# Patient Record
Sex: Female | Born: 1960 | Race: White | Hispanic: No | Marital: Married | Smoking: Former smoker
Health system: Southern US, Community
[De-identification: ages and names within clinical notes are randomized; demographics above are authoritative.]

## PROBLEM LIST (undated history)

## (undated) DIAGNOSIS — E119 Type 2 diabetes mellitus without complications: Secondary | ICD-10-CM

## (undated) DIAGNOSIS — Z8489 Family history of other specified conditions: Secondary | ICD-10-CM

## (undated) DIAGNOSIS — E039 Hypothyroidism, unspecified: Secondary | ICD-10-CM

## (undated) DIAGNOSIS — I10 Essential (primary) hypertension: Secondary | ICD-10-CM

## (undated) DIAGNOSIS — M1990 Unspecified osteoarthritis, unspecified site: Secondary | ICD-10-CM

## (undated) DIAGNOSIS — E785 Hyperlipidemia, unspecified: Secondary | ICD-10-CM

## (undated) HISTORY — DX: Hyperlipidemia, unspecified: E78.5

## (undated) HISTORY — PX: KNEE ARTHROSCOPY: SUR90

---

## 1998-08-02 ENCOUNTER — Encounter: Admission: RE | Admit: 1998-08-02 | Discharge: 1998-08-24 | Payer: Self-pay | Admitting: *Deleted

## 2000-02-08 ENCOUNTER — Encounter: Admission: RE | Admit: 2000-02-08 | Discharge: 2000-02-08 | Payer: Self-pay | Admitting: *Deleted

## 2000-02-08 ENCOUNTER — Encounter: Payer: Self-pay | Admitting: *Deleted

## 2002-01-05 ENCOUNTER — Encounter: Admission: RE | Admit: 2002-01-05 | Discharge: 2002-01-05 | Payer: Self-pay | Admitting: Family Medicine

## 2002-01-05 ENCOUNTER — Encounter: Payer: Self-pay | Admitting: Family Medicine

## 2002-12-21 ENCOUNTER — Encounter: Payer: Self-pay | Admitting: Family Medicine

## 2002-12-21 ENCOUNTER — Encounter: Admission: RE | Admit: 2002-12-21 | Discharge: 2002-12-21 | Payer: Self-pay | Admitting: Family Medicine

## 2002-12-28 ENCOUNTER — Other Ambulatory Visit: Admission: RE | Admit: 2002-12-28 | Discharge: 2002-12-28 | Payer: Self-pay | Admitting: Family Medicine

## 2004-06-01 ENCOUNTER — Other Ambulatory Visit: Admission: RE | Admit: 2004-06-01 | Discharge: 2004-06-01 | Payer: Self-pay | Admitting: Family Medicine

## 2004-08-08 ENCOUNTER — Encounter: Admission: RE | Admit: 2004-08-08 | Discharge: 2004-08-08 | Payer: Self-pay | Admitting: Family Medicine

## 2005-03-11 ENCOUNTER — Encounter: Admission: RE | Admit: 2005-03-11 | Discharge: 2005-03-11 | Payer: Self-pay | Admitting: Family Medicine

## 2005-07-29 ENCOUNTER — Other Ambulatory Visit: Admission: RE | Admit: 2005-07-29 | Discharge: 2005-07-29 | Payer: Self-pay | Admitting: Family Medicine

## 2005-08-30 ENCOUNTER — Encounter: Admission: RE | Admit: 2005-08-30 | Discharge: 2005-08-30 | Payer: Self-pay | Admitting: Family Medicine

## 2006-07-18 ENCOUNTER — Other Ambulatory Visit: Admission: RE | Admit: 2006-07-18 | Discharge: 2006-07-18 | Payer: Self-pay | Admitting: Family Medicine

## 2006-09-09 ENCOUNTER — Encounter: Admission: RE | Admit: 2006-09-09 | Discharge: 2006-09-09 | Payer: Self-pay | Admitting: Family Medicine

## 2007-07-10 ENCOUNTER — Other Ambulatory Visit: Admission: RE | Admit: 2007-07-10 | Discharge: 2007-07-10 | Payer: Self-pay | Admitting: Family Medicine

## 2007-09-11 ENCOUNTER — Encounter: Admission: RE | Admit: 2007-09-11 | Discharge: 2007-09-11 | Payer: Self-pay | Admitting: Family Medicine

## 2008-07-12 ENCOUNTER — Other Ambulatory Visit: Admission: RE | Admit: 2008-07-12 | Discharge: 2008-07-12 | Payer: Self-pay | Admitting: Family Medicine

## 2008-09-12 ENCOUNTER — Encounter: Admission: RE | Admit: 2008-09-12 | Discharge: 2008-09-12 | Payer: Self-pay | Admitting: Family Medicine

## 2010-02-10 IMAGING — MG MM SCREEN MAMMOGRAM BILATERAL
5 series · 5 of 5 positions shown · non-contrast
Comparison: none

DG SCREEN MAMMOGRAM BILATERAL
Bilateral CC and MLO view(s) were taken.

DIGITAL SCREENING MAMMOGRAM WITH CAD:
There are scattered fibroglandular densities.  No masses or malignant type calcifications are 
identified.  Compared with prior studies.

[R CC]
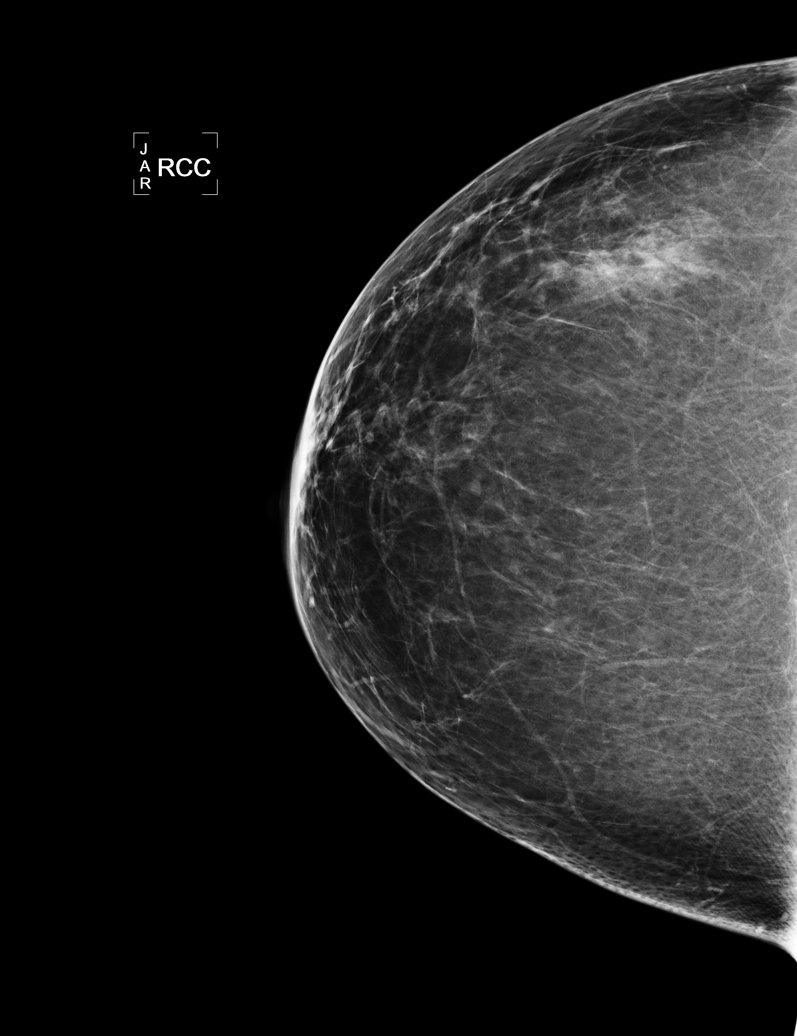

[L CC]
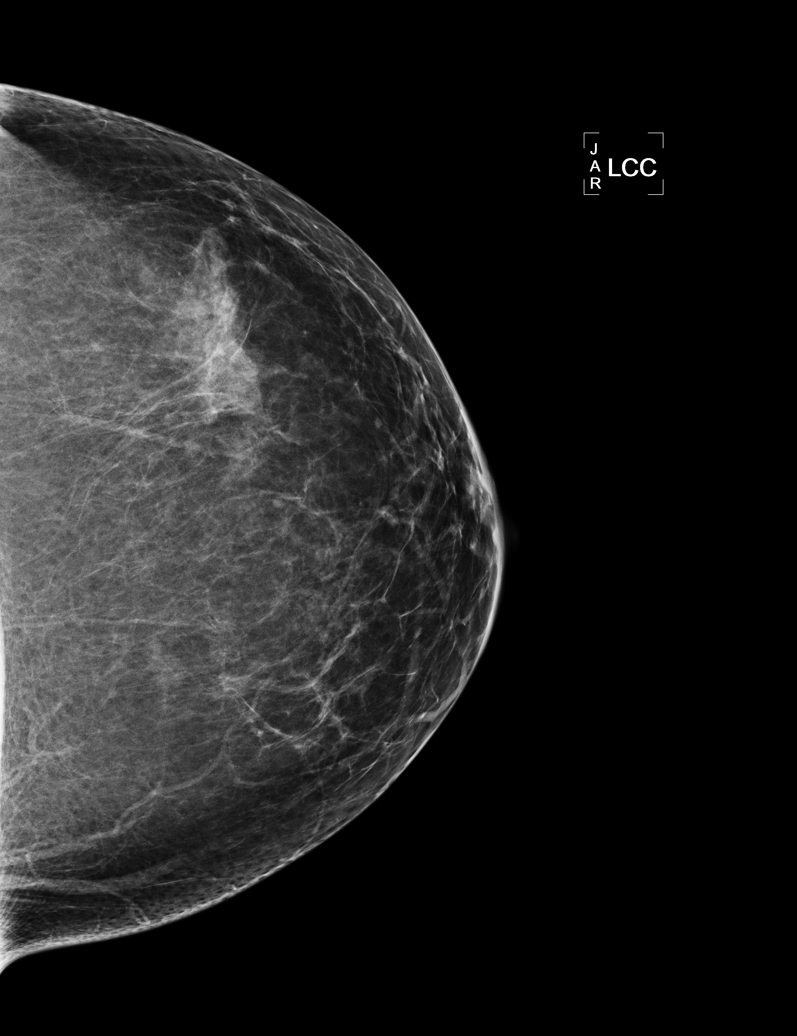

[L MLO (1 of 2)]
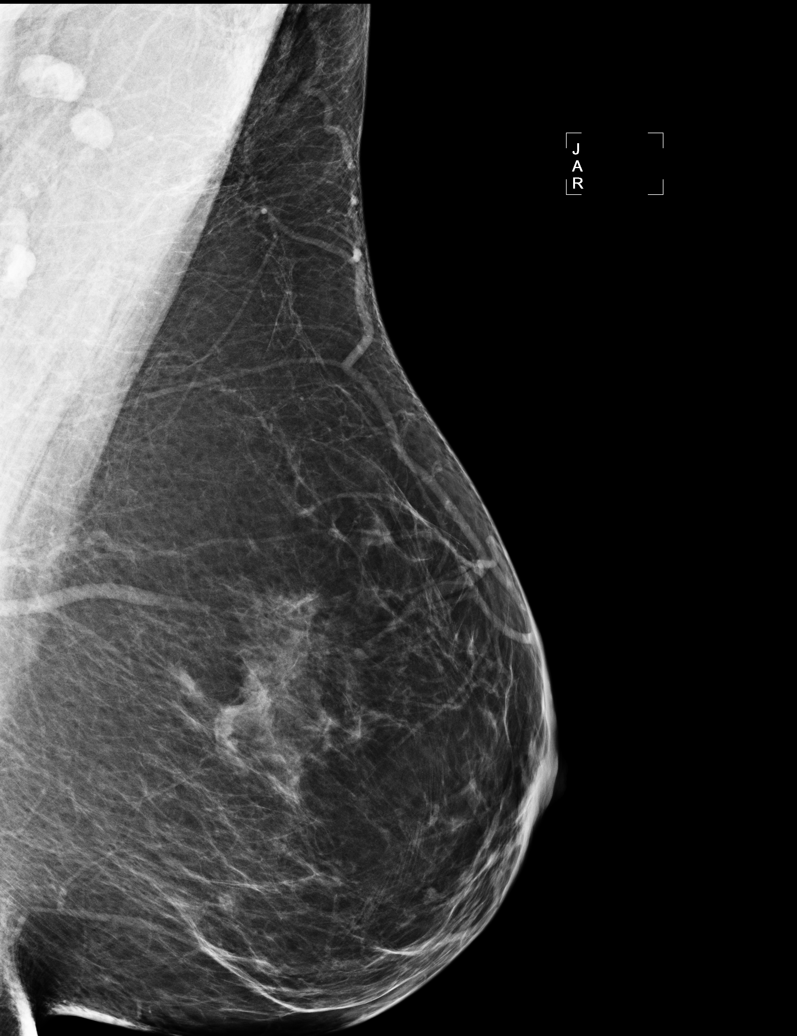

[R MLO]
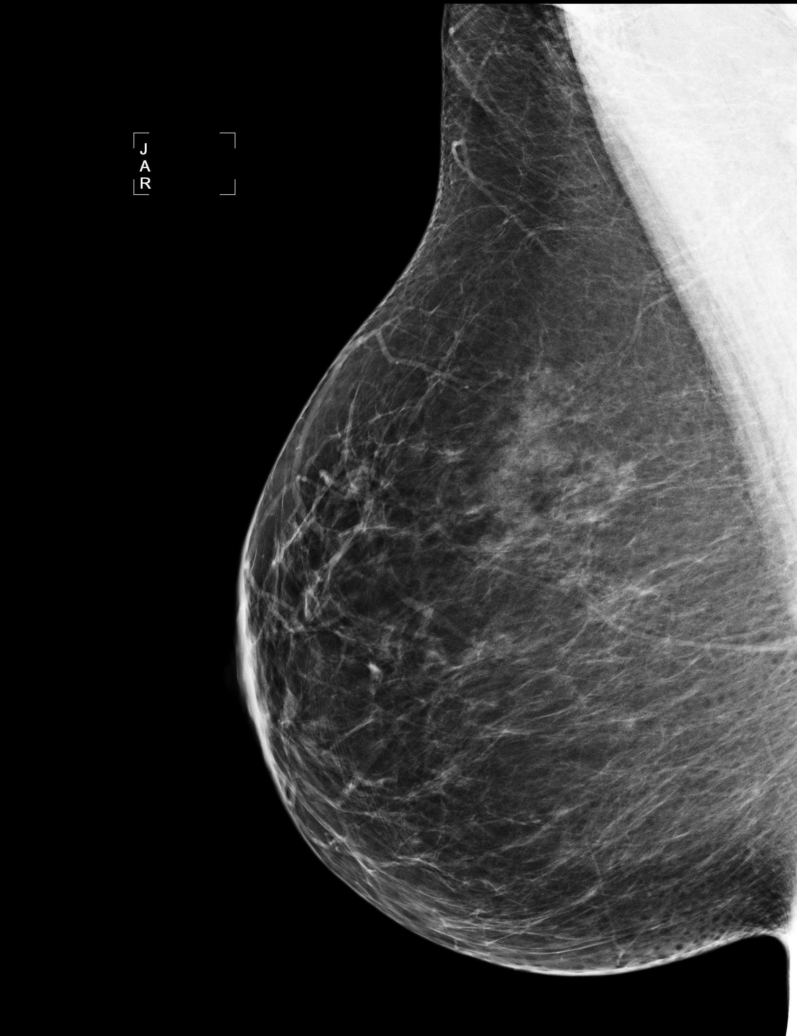

[L MLO (2 of 2)]
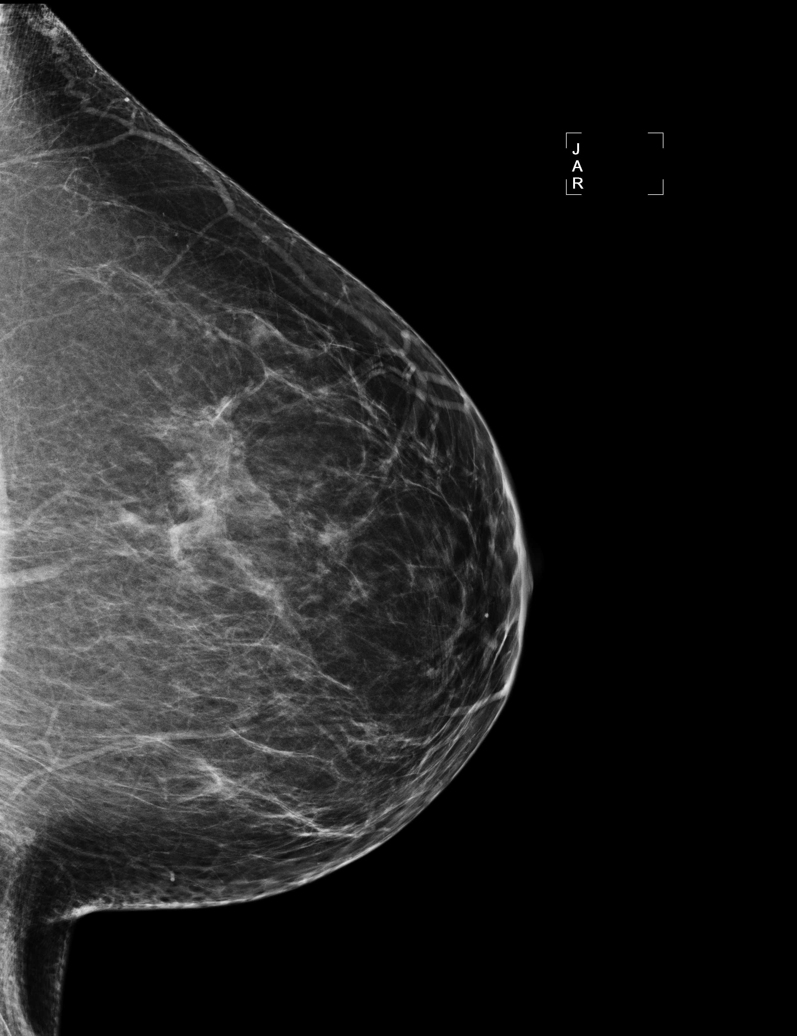

[5 of 5 positions shown; findings below may reference images not displayed]

IMPRESSION: No specific mammographic evidence of malignancy.  Next screening mammogram is recommended in one 
year.

ASSESSMENT: Negative - BI-RADS 1

Screening mammogram in 1 year.
ANALYZED BY COMPUTER AIDED DETECTION. , THIS PROCEDURE WAS A DIGITAL MAMMOGRAM.

## 2010-08-21 ENCOUNTER — Other Ambulatory Visit
Admission: RE | Admit: 2010-08-21 | Discharge: 2010-08-21 | Payer: Self-pay | Source: Home / Self Care | Admitting: Internal Medicine

## 2012-03-16 ENCOUNTER — Other Ambulatory Visit: Payer: Self-pay | Admitting: Internal Medicine

## 2012-03-16 DIAGNOSIS — Z1231 Encounter for screening mammogram for malignant neoplasm of breast: Secondary | ICD-10-CM

## 2012-03-31 ENCOUNTER — Ambulatory Visit: Payer: Self-pay

## 2012-04-06 ENCOUNTER — Ambulatory Visit
Admission: RE | Admit: 2012-04-06 | Discharge: 2012-04-06 | Disposition: A | Discharge status: Home / Self Care | Payer: PRIVATE HEALTH INSURANCE | Source: Ambulatory Visit | Attending: Internal Medicine | Admitting: Internal Medicine

## 2012-04-06 DIAGNOSIS — Z1231 Encounter for screening mammogram for malignant neoplasm of breast: Secondary | ICD-10-CM

## 2013-06-23 ENCOUNTER — Other Ambulatory Visit: Payer: Self-pay | Admitting: Internal Medicine

## 2013-06-23 DIAGNOSIS — R609 Edema, unspecified: Secondary | ICD-10-CM

## 2013-06-28 ENCOUNTER — Other Ambulatory Visit: Payer: PRIVATE HEALTH INSURANCE

## 2013-09-05 IMAGING — MG MM DIGITAL SCREENING BILAT
4 series · 4 of 4 positions shown · non-contrast
Comparison: Previous exams.

CLINICAL DATA: Screening.

DIGITAL BILATERAL SCREENING MAMMOGRAM WITH CAD

[R CC]
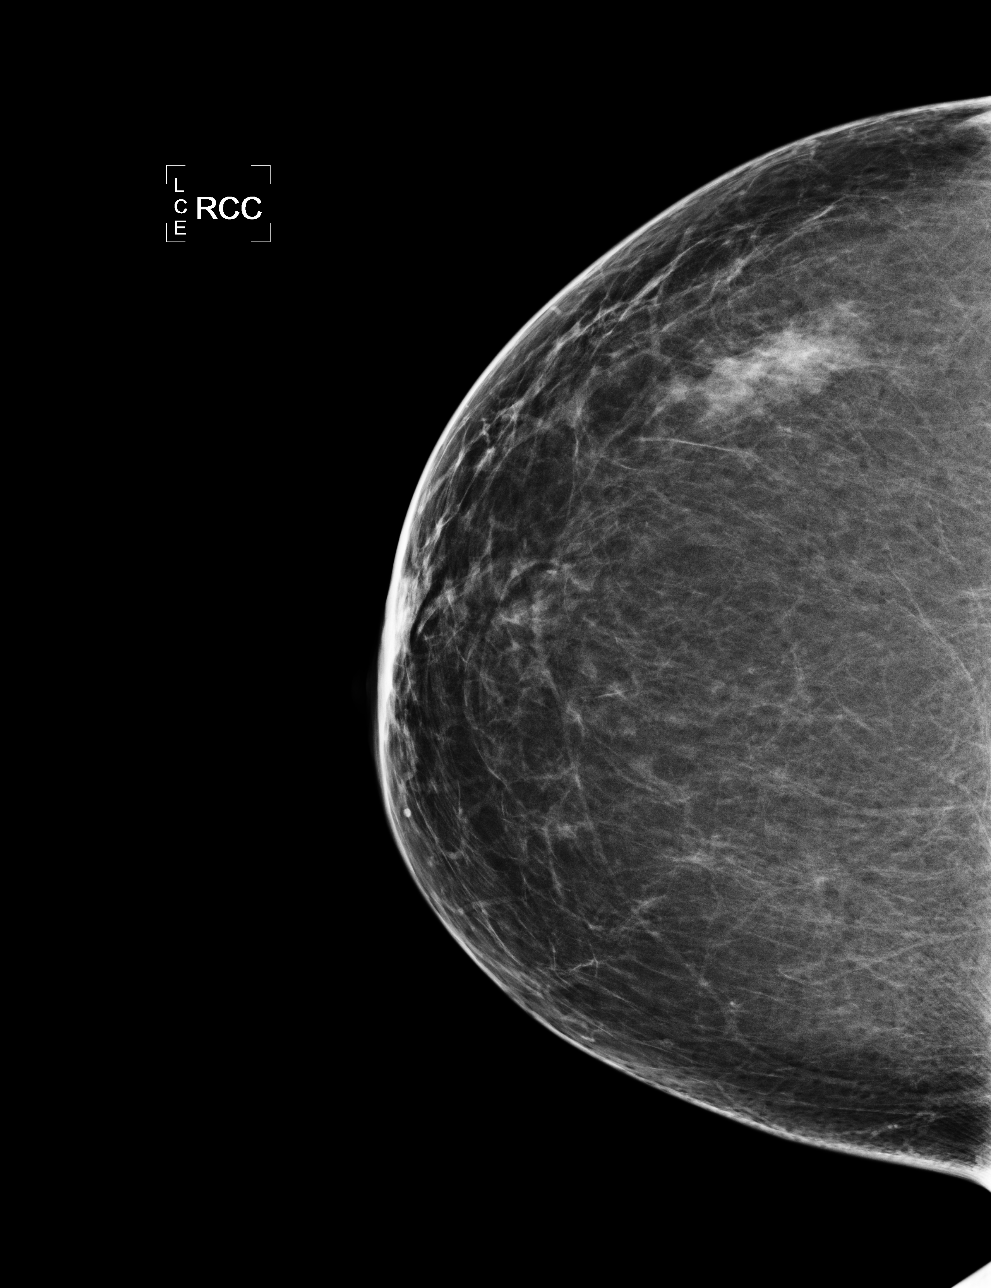

[L CC]
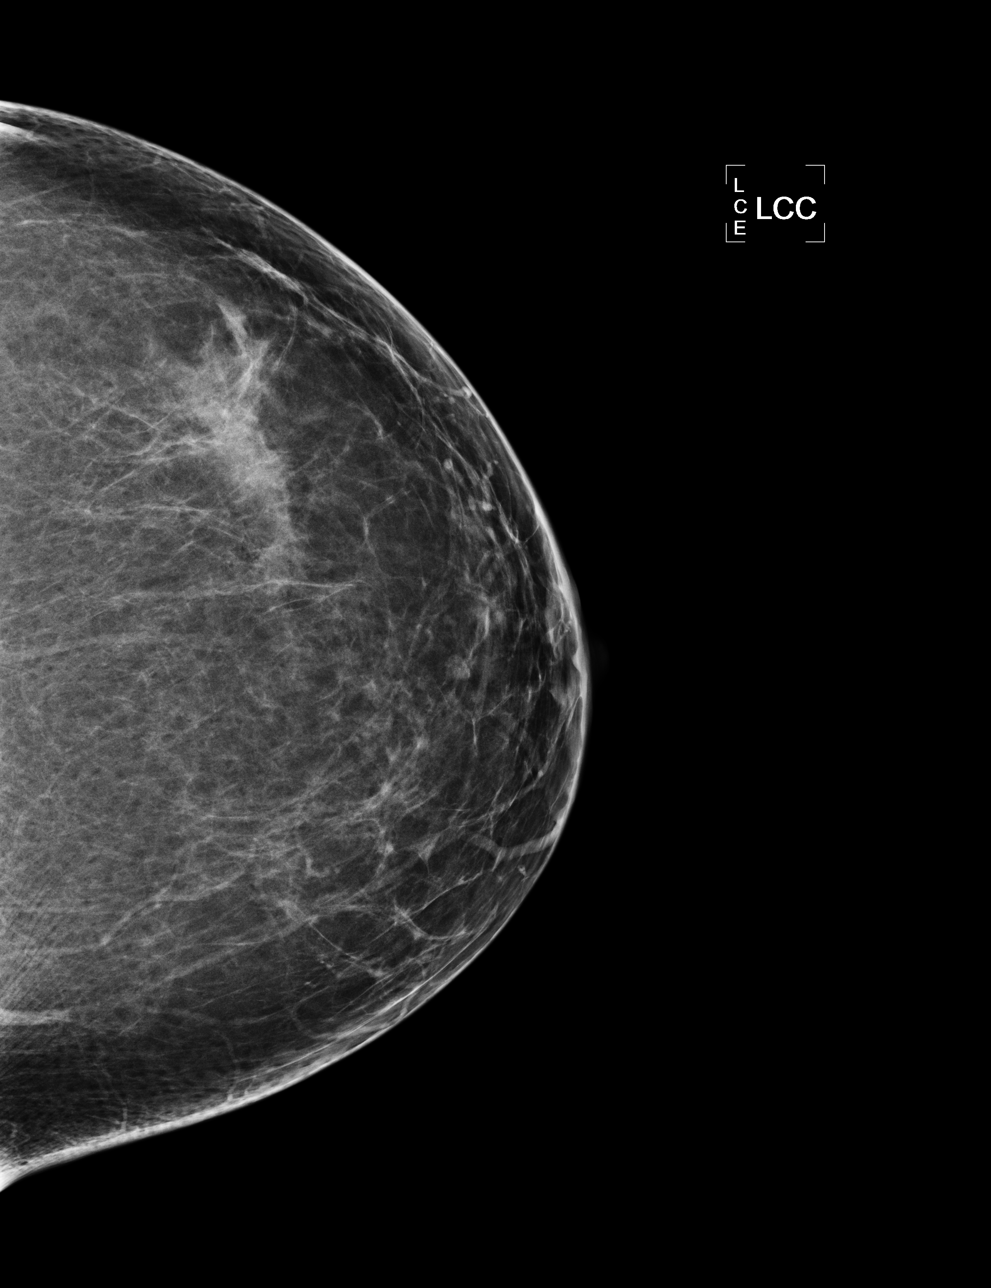

[L MLO]
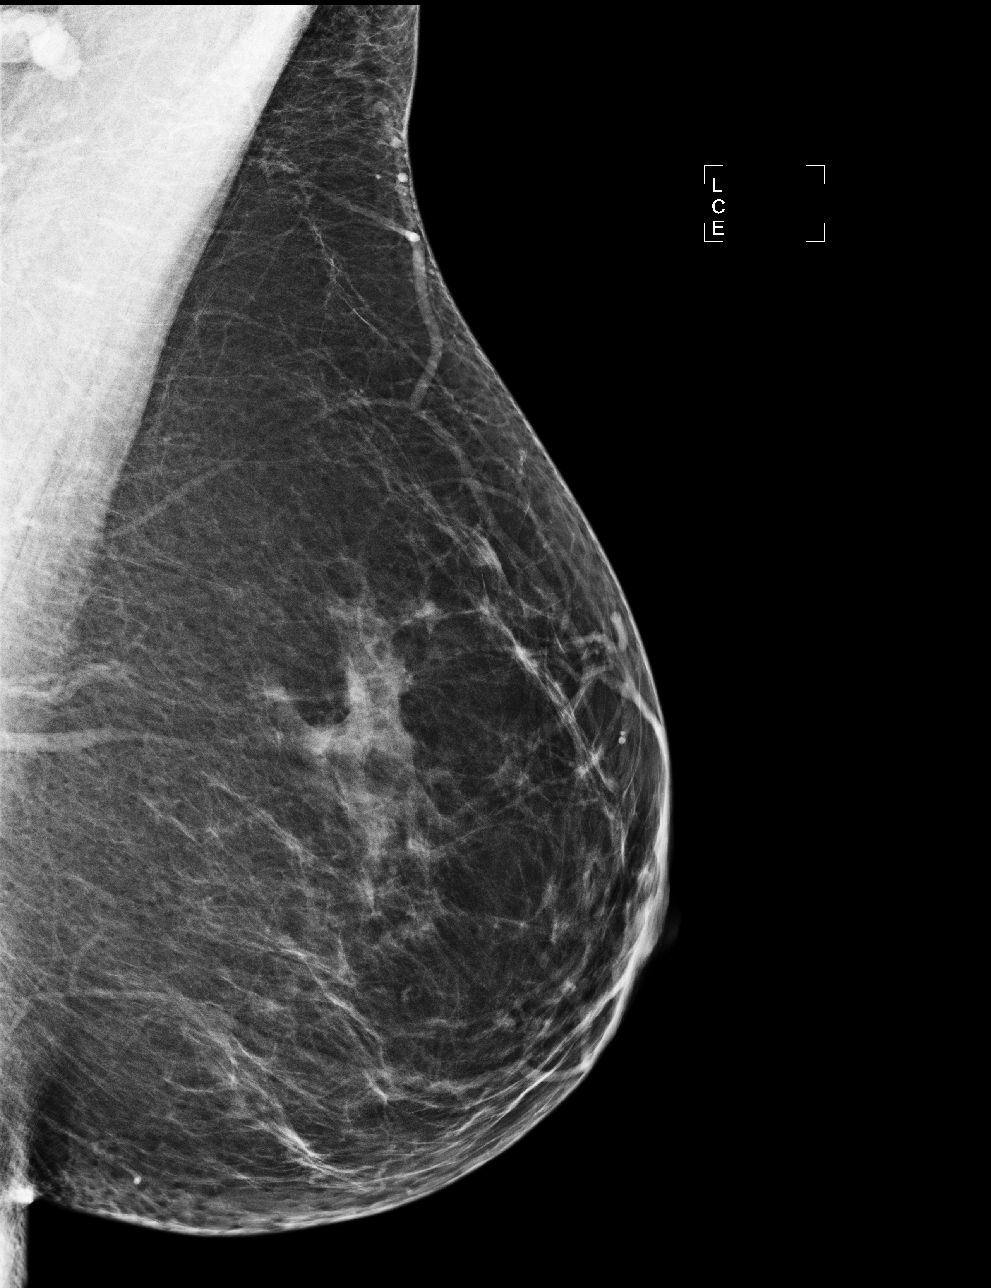

[R MLO]
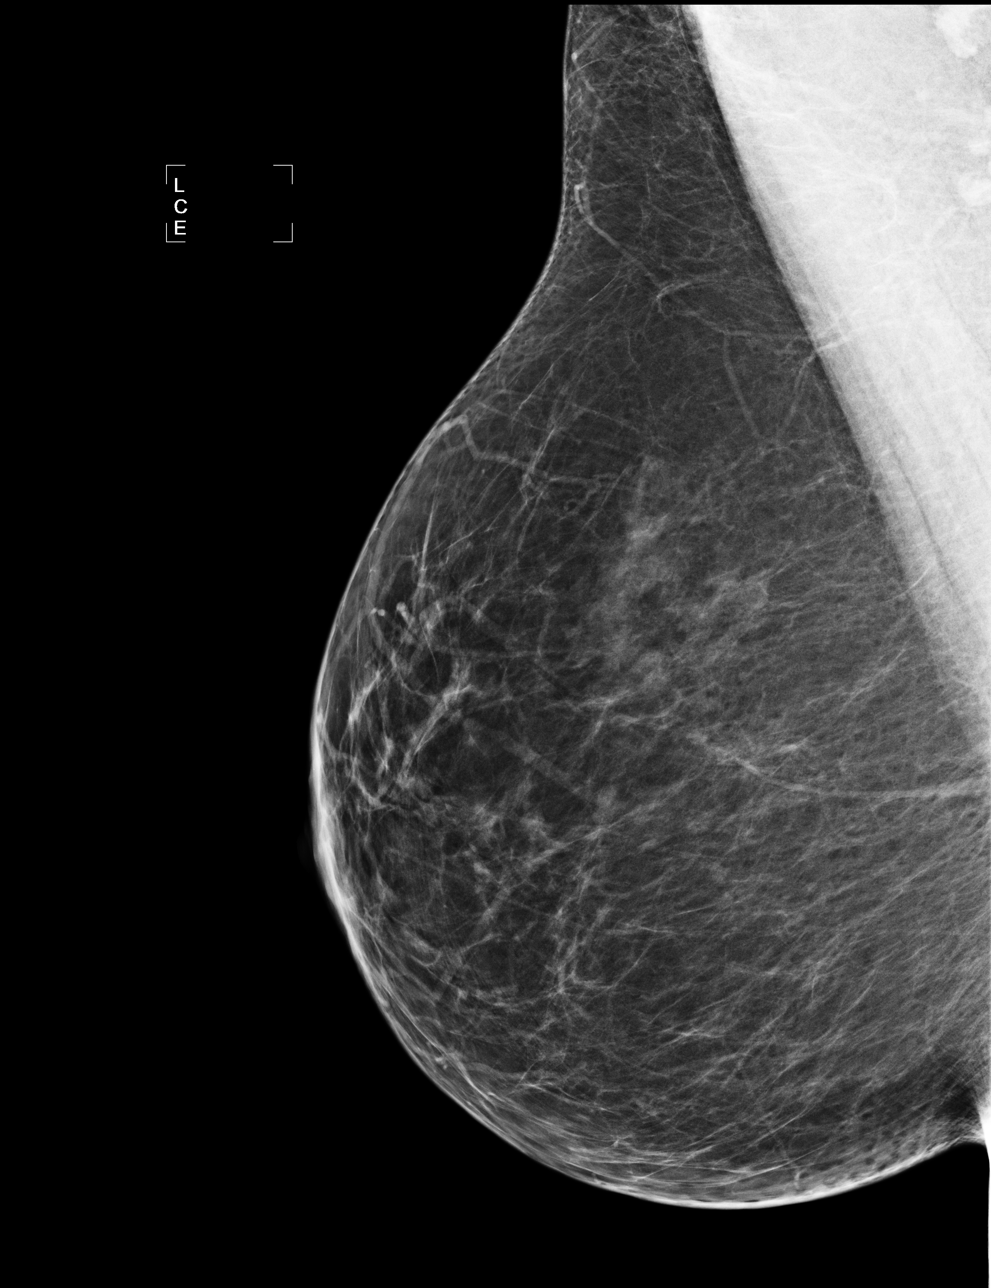

[4 of 4 positions shown; findings below may reference images not displayed]

FINDINGS: There are scattered fibroglandular densities. No
suspicious masses, architectural distortion, or calcifications are
present.

Images were processed with CAD.
IMPRESSION: No mammographic evidence of malignancy.

A result letter of this screening mammogram will be mailed directly
to the patient.

RECOMMENDATION:
Screening mammogram in one year. (Code:[id number]-[id number])

BI-RADS CATEGORY 1:  Negative.

## 2014-11-02 ENCOUNTER — Other Ambulatory Visit (HOSPITAL_COMMUNITY)
Admission: RE | Admit: 2014-11-02 | Discharge: 2014-11-02 | Disposition: A | Discharge status: Home / Self Care | Payer: 59 | Source: Ambulatory Visit | Attending: Internal Medicine | Admitting: Internal Medicine

## 2014-11-02 DIAGNOSIS — Z01419 Encounter for gynecological examination (general) (routine) without abnormal findings: Secondary | ICD-10-CM | POA: Diagnosis not present

## 2015-04-25 ENCOUNTER — Ambulatory Visit: Payer: PRIVATE HEALTH INSURANCE

## 2019-11-25 ENCOUNTER — Other Ambulatory Visit: Payer: Self-pay | Admitting: Internal Medicine

## 2019-11-25 DIAGNOSIS — Z1231 Encounter for screening mammogram for malignant neoplasm of breast: Secondary | ICD-10-CM

## 2020-01-17 ENCOUNTER — Other Ambulatory Visit: Payer: Self-pay

## 2020-01-17 ENCOUNTER — Ambulatory Visit
Admission: RE | Admit: 2020-01-17 | Discharge: 2020-01-17 | Disposition: A | Discharge status: Home / Self Care | Payer: 59 | Source: Ambulatory Visit | Attending: Internal Medicine | Admitting: Internal Medicine

## 2020-01-17 ENCOUNTER — Ambulatory Visit: Payer: PRIVATE HEALTH INSURANCE

## 2020-01-17 DIAGNOSIS — Z1231 Encounter for screening mammogram for malignant neoplasm of breast: Secondary | ICD-10-CM

## 2020-04-26 ENCOUNTER — Ambulatory Visit (INDEPENDENT_AMBULATORY_CARE_PROVIDER_SITE_OTHER): Payer: 59

## 2020-04-26 ENCOUNTER — Encounter: Payer: Self-pay | Admitting: Podiatry

## 2020-04-26 ENCOUNTER — Ambulatory Visit (INDEPENDENT_AMBULATORY_CARE_PROVIDER_SITE_OTHER): Payer: 59 | Admitting: Podiatry

## 2020-04-26 VITALS — BP 139/57 | HR 78 | Temp 96.0°F | Resp 16

## 2020-04-26 DIAGNOSIS — M779 Enthesopathy, unspecified: Secondary | ICD-10-CM | POA: Diagnosis not present

## 2020-04-26 DIAGNOSIS — S90229A Contusion of unspecified lesser toe(s) with damage to nail, initial encounter: Secondary | ICD-10-CM

## 2020-04-26 NOTE — Progress Notes (Signed)
Subjective:   Patient ID: Leslie Potts, female   DOB: 59 y.o.   MRN: [id number]   HPI Patient presents stating that she hurt her toe about 5 weeks ago and the joint has become very tender and swollen and she wants to make sure she does not have a fracture and states she cannot bear good weight down on this foot.  Patient smokes 1/4 pack/day and likes to be active   Review of Systems  All other systems reviewed and are negative.       Objective:  Physical Exam Vitals and nursing note reviewed.  Constitutional:      Appearance: She is well-developed.  Pulmonary:     Effort: Pulmonary effort is normal.  Musculoskeletal:        General: Normal range of motion.  Skin:    General: Skin is warm.  Neurological:     Mental Status: She is alert.     Neurovascular status intact muscle strength found to be adequate range of motion within normal limits.  Patient is noted to have exquisite discomfort in the right second MPJ with fluid buildup around the joint and swelling around the joint surface.  Patient has good digital perfusion well oriented x3     Assessment:  Possibility for inflammatory capsulitis second MPJ with previous injury with possibility for fracture of the digit or other pathology     Plan:  H&P x-ray reviewed condition discussed.  At this point I did do a sterile forefoot block I then aspirated the joint getting out a small amount of clear fluid with no blood injected quarter cc dexamethasone and applied padding to reduce pressure.  This should hopefully improve the result and if any issues were to occur she is to let us know and I did not see fracture of the digit  X-rays indicate no signs of fracture of the digit good alignment noted currently with no signs of long-term pathology

## 2020-06-27 ENCOUNTER — Other Ambulatory Visit: Payer: Self-pay | Admitting: Internal Medicine

## 2020-06-27 DIAGNOSIS — Z720 Tobacco use: Secondary | ICD-10-CM

## 2020-07-11 ENCOUNTER — Ambulatory Visit
Admission: RE | Admit: 2020-07-11 | Discharge: 2020-07-11 | Disposition: A | Discharge status: Home / Self Care | Payer: 59 | Source: Ambulatory Visit | Attending: Internal Medicine | Admitting: Internal Medicine

## 2020-07-11 DIAGNOSIS — Z720 Tobacco use: Secondary | ICD-10-CM

## 2020-09-22 ENCOUNTER — Ambulatory Visit
Admission: RE | Admit: 2020-09-22 | Discharge: 2020-09-22 | Disposition: A | Discharge status: Home / Self Care | Payer: 59 | Source: Ambulatory Visit | Attending: Physician Assistant | Admitting: Physician Assistant

## 2020-09-22 ENCOUNTER — Other Ambulatory Visit: Payer: Self-pay | Admitting: Physician Assistant

## 2020-09-22 ENCOUNTER — Other Ambulatory Visit: Payer: Self-pay

## 2020-09-22 DIAGNOSIS — R52 Pain, unspecified: Secondary | ICD-10-CM

## 2020-09-26 NOTE — Pre-Procedure Instructions (Signed)
[id number] [place], [place] [facility] [place] [address] [place] [address] Phone: [phone] Fax: [phone]      Your procedure is scheduled on Thursday, January 27th at 7:30 A.M.  Report to [facility] Main Entrance "A" at 5:30 A.M., and check in at the Admitting office.  Call this number if you have problems the morning of surgery:  [phone]  Call [phone] if you have any questions prior to your surgery date Monday-Friday 8am-4pm    Remember:  Do not eat after midnight the night before your surgery  You may drink clear liquids until 4:30 A.M. the morning of your surgery.   Clear liquids allowed are: Water, Non-Citrus Juices (without pulp), Carbonated Beverages, Clear Tea, Black Coffee Only, and Gatorade. Please chose diet or no sugar options.    Please complete the 10oz bottle of water that was provided to you by 4:30 a.m. the morning of your surgery.   Take these medicines the morning of surgery:  carvedilol (COREG) levothyroxine (SYNTHROID)  Eye drops-use as needed  Follow your surgeon's instructions on when to stop Aspirin.  If no instructions were given by your surgeon then you will need to call the office to get those instructions.    As of today, STOP taking any Aspirin (unless otherwise instructed by your surgeon) Aleve, Naproxen, Ibuprofen, Motrin, Advil, Goody's, BC's, all herbal medications, fish oil, and all vitamins.   WHAT DO I DO ABOUT MY DIABETES MEDICATION?   Marland Kitchen Do not take pioglitazone (ACTOS) or metFORMIN (GLUCOPHAGE)  the morning of surgery.  . The day of surgery, do not take diabetes injectables (OZEMPIC).   HOW TO MANAGE YOUR DIABETES BEFORE AND AFTER SURGERY  Why is it important to control my blood sugar before and after surgery? . Improving blood sugar levels before and after surgery helps healing and can limit problems. . A way of improving blood sugar  control is eating a healthy diet by: o  Eating less sugar and carbohydrates o  Increasing activity/exercise o  Talking with your doctor about reaching your blood sugar goals . High blood sugars (greater than 180 mg/dL) can raise your risk of infections and slow your recovery, so you will need to focus on controlling your diabetes during the weeks before surgery. . Make sure that the doctor who takes care of your diabetes knows about your planned surgery including the date and location.  How do I manage my blood sugar before surgery? . Check your blood sugar at least 4 times a day, starting 2 days before surgery, to make sure that the level is not too high or low. . Check your blood sugar the morning of your surgery when you wake up and every 2 hours until you get to the Short Stay unit. o If your blood sugar is less than 70 mg/dL, you will need to treat for low blood sugar: - Do not take insulin. - Treat a low blood sugar (less than 70 mg/dL) with  cup of clear juice (cranberry or apple), 4 glucose tablets, OR glucose gel. - Recheck blood sugar in 15 minutes after treatment (to make sure it is greater than 70 mg/dL). If your blood sugar is not greater than 70 mg/dL on recheck, call [phone] for further instructions. . Report your blood sugar to the short stay nurse when you get to Short Stay.  . If you are admitted to the hospital after surgery: o Your blood  sugar will be checked by the staff and you will probably be given insulin after surgery (instead of oral diabetes medicines) to make sure you have good blood sugar levels. o The goal for blood sugar control after surgery is 80-180 mg/dL.              Do not wear jewelry, make up, or nail polish            Do not wear lotions, powders, perfumes, or deodorant.            Do not shave 48 hours prior to surgery.            Do not bring valuables to the hospital.            [facility] is not responsible for any belongings or  valuables.  Do NOT Smoke (Tobacco/Vaping) or drink Alcohol 24 hours prior to your procedure If you use a CPAP at night, you may bring all equipment for your overnight stay.   Contacts, glasses, dentures or bridgework may not be worn into surgery.      For patients admitted to the hospital, discharge time will be determined by your treatment team.   Patients discharged the day of surgery will not be allowed to drive home, and someone needs to stay with them for 24 hours.    Special instructions:                                     [facility]- Preparing for Total Shoulder Arthroplasty   Before surgery, you can play an important role. Because skin is not sterile, your skin needs to be as free of germs as possible. You can reduce the number of germs on your skin by using the following products. . Benzoyl Peroxide Gel o Reduces the number of germs present on the skin o Applied twice a day to shoulder area starting two days before surgery   . Chlorhexidine Gluconate (CHG) Soap o An antiseptic cleaner that kills germs and bonds with the skin to continue killing germs even after washing o Used for showering the night before surgery and morning of surgery   Oral Hygiene is also important to reduce your risk of infection.                                    Remember - BRUSH YOUR TEETH THE MORNING OF SURGERY WITH YOUR REGULAR TOOTHPASTE  ==================================================================  Please follow these instructions carefully:  BENZOYL PEROXIDE 5% GEL  Please do not use if you have an allergy to benzoyl peroxide.   If your skin becomes reddened/irritated stop using the benzoyl peroxide.  Starting two days before surgery, apply as follows: 1. Apply benzoyl peroxide in the morning and at night. Apply after taking a shower. If you are not taking a shower clean entire shoulder front, back, and side along with the armpit with a clean wet washcloth.  2. Place a  quarter-sized dollop on your shoulder and rub in thoroughly, making sure to cover the front, back, and side of your shoulder, along with the armpit.   2 days before ____ AM   ____ PM              1 day before ____ AM   ____ PM  3.  Do this twice a day for two days.  (Last application is the night before surgery, AFTER using the CHG soap as described below).  4. Do NOT apply benzoyl peroxide gel on the day of surgery.  CHLORHEXIDINE GLUCONATE (CHG) SOAP  Please do not use if you have an allergy to CHG or antibacterial soaps. If your skin becomes reddened/irritated stop using the CHG.   Do not shave (including legs and underarms) for at least 48 hours prior to first CHG shower. It is OK to shave your face.  Starting the night before surgery, use CHG soap as follows:  1. Shower the NIGHT BEFORE SURGERY and MORNING OF SURGERY with CHG.  2. If you choose to wash your hair, wash your hair first as usual with your normal shampoo.  3. After shampooing, rinse your hair and body thoroughly to remove the shampoo.  4. Use CHG as you would any other liquid soap.  You can apply CHG directly to the skin and wash gently with a scrungie or a clean washcloth.  5. Apply the CHG soap to your body ONLY FROM THE NECK DOWN.  Do not use on open wounds or open sores.  Avoid contact with your eyes, ears, mouth, and genitals (private parts).  Wash face and genitals (private parts) with your normal soap.  6. Wash thoroughly, paying special attention to the area where your surgery will be performed.  7. Thoroughly rinse your body with warm water from the neck down.  8. DO NOT shower/wash with your normal soap after using and rinsing off the CHG soap.   9. Pat yourself dry with a CLEAN TOWEL.   10.  Apply benzoyl peroxide.   11. Wear CLEAN PAJAMAS to bed the night before surgery; wear comfortable clothes the morning of surgery.  12. Place CLEAN SHEETS on your bed the night of  your first shower and DO NOT SLEEP WITH PETS.  Day of Surgery: Shower as above Do not apply any deodorants/lotions.  Please wear clean clothes to the hospital/surgery center.   Remember to brush your teeth WITH YOUR REGULAR TOOTHPASTE.     Day of Surgery: Wear Clean/Comfortable clothing the morning of surgery Do not apply any deodorants/lotions.   Remember to brush your teeth WITH YOUR REGULAR TOOTHPASTE.   Please read over the following fact sheets that you were given.

## 2020-09-27 ENCOUNTER — Encounter (HOSPITAL_COMMUNITY): Payer: Self-pay

## 2020-09-27 ENCOUNTER — Other Ambulatory Visit (HOSPITAL_COMMUNITY)
Admission: RE | Admit: 2020-09-27 | Discharge: 2020-09-27 | Disposition: A | Discharge status: Home / Self Care | Payer: 59 | Source: Ambulatory Visit | Attending: Orthopaedic Surgery | Admitting: Orthopaedic Surgery

## 2020-09-27 ENCOUNTER — Other Ambulatory Visit: Payer: Self-pay

## 2020-09-27 ENCOUNTER — Encounter (HOSPITAL_COMMUNITY)
Admission: RE | Admit: 2020-09-27 | Discharge: 2020-09-27 | Disposition: A | Discharge status: Home / Self Care | Payer: 59 | Source: Ambulatory Visit | Attending: Orthopaedic Surgery | Admitting: Orthopaedic Surgery

## 2020-09-27 DIAGNOSIS — Z20822 Contact with and (suspected) exposure to covid-19: Secondary | ICD-10-CM | POA: Insufficient documentation

## 2020-09-27 DIAGNOSIS — Z01812 Encounter for preprocedural laboratory examination: Secondary | ICD-10-CM | POA: Insufficient documentation

## 2020-09-27 HISTORY — DX: Hypothyroidism, unspecified: E03.9

## 2020-09-27 HISTORY — DX: Type 2 diabetes mellitus without complications: E11.9

## 2020-09-27 HISTORY — DX: Family history of other specified conditions: Z84.89

## 2020-09-27 HISTORY — DX: Essential (primary) hypertension: I10

## 2020-09-27 HISTORY — DX: Unspecified osteoarthritis, unspecified site: M19.90

## 2020-09-27 LAB — COMPREHENSIVE METABOLIC PANEL
ALT: 27 U/L (ref 0–44)
AST: 27 U/L (ref 15–41)
Albumin: 3.2 g/dL — ABNORMAL LOW (ref 3.5–5.0)
Alkaline Phosphatase: 59 U/L (ref 38–126)
Anion gap: 11 (ref 5–15)
BUN: 10 mg/dL (ref 6–20)
CO2: 23 mmol/L (ref 22–32)
Calcium: 8.8 mg/dL — ABNORMAL LOW (ref 8.9–10.3)
Chloride: 104 mmol/L (ref 98–111)
Creatinine, Ser: 0.51 mg/dL (ref 0.44–1.00)
GFR, Estimated: >60 mL/min (ref >60)
Glucose, Bld: 174 mg/dL — ABNORMAL HIGH (ref 70–99)
Potassium: 4 mmol/L (ref 3.5–5.1)
Sodium: 138 mmol/L (ref 135–145)
Total Bilirubin: 0.4 mg/dL (ref 0.3–1.2)
Total Protein: 6.3 g/dL — ABNORMAL LOW (ref 6.5–8.1)

## 2020-09-27 LAB — SURGICAL PCR SCREEN
MRSA, PCR: NEGATIVE
Staphylococcus aureus: POSITIVE — AB

## 2020-09-27 LAB — CBC
HCT: 32.1 % — ABNORMAL LOW (ref 36.0–46.0)
Hemoglobin: 11.1 g/dL — ABNORMAL LOW (ref 12.0–15.0)
MCH: 32.8 pg (ref 26.0–34.0)
MCHC: 34.6 g/dL (ref 30.0–36.0)
MCV: 95 fL (ref 80.0–100.0)
Platelets: 358 10*3/uL (ref 150–400)
RBC: 3.38 MIL/uL — ABNORMAL LOW (ref 3.87–5.11)
RDW: 12.2 % (ref 11.5–15.5)
WBC: 8.6 10*3/uL (ref 4.0–10.5)
nRBC: 0 % (ref 0.0–0.2)

## 2020-09-27 LAB — GLUCOSE, CAPILLARY: Glucose-Capillary: 180 mg/dL — ABNORMAL HIGH (ref 70–99)

## 2020-09-27 MED ORDER — TRANEXAMIC ACID 1000 MG/10ML IV SOLN
2000.0000 mg | INTRAVENOUS | Status: DC
  Filled 2020-09-27: qty 20

## 2020-09-27 NOTE — Progress Notes (Signed)
PCP - [facility] Cardiologist - na   Chest x-ray - na EKG -  09/27/20 Stress Test - 2014  'normal"  -------work physical ( pt. Doesn't remember who did this test) ECHO - 2014--pt. States it showed a leaky valve, no follow up needed Cardiac Cath -  na  Sleep Study - na   Fasting Blood Sugar -  130-170 Checks Blood Sugar __every_other day  Blood Thinner Instructions: na Aspirin Instructions:  na  ERAS Protcol - yes PRE-SURGERY bottle of water given   COVID TEST-  09/27/20   request hgb A1c results/other labs /any cardiac studies   Anesthesia review: Jeneen Rinks burnes,[place] notified of cardiac studies  Patient denies shortness of breath, fever, cough and chest pain at PAT appointment   All instructions explained to the patient, with a verbal understanding of the material. Patient agrees to go over the instructions while at home for a better understanding. Patient also instructed to self quarantine after being tested for COVID-19. The opportunity to ask questions was provided.

## 2020-09-28 ENCOUNTER — Ambulatory Visit (HOSPITAL_COMMUNITY): Payer: 59

## 2020-09-28 ENCOUNTER — Encounter (HOSPITAL_COMMUNITY): Payer: Self-pay | Admitting: Orthopaedic Surgery

## 2020-09-28 ENCOUNTER — Ambulatory Visit (HOSPITAL_COMMUNITY): Payer: 59 | Admitting: Physician Assistant

## 2020-09-28 ENCOUNTER — Encounter (HOSPITAL_COMMUNITY)
Admission: RE | Disposition: A | Discharge status: Home / Self Care | Payer: Self-pay | Source: Home / Self Care | Attending: Orthopaedic Surgery

## 2020-09-28 ENCOUNTER — Ambulatory Visit (HOSPITAL_COMMUNITY)
Admission: RE | Admit: 2020-09-28 | Discharge: 2020-09-28 | Disposition: A | Discharge status: Home / Self Care | Payer: 59 | Attending: Orthopaedic Surgery | Admitting: Orthopaedic Surgery

## 2020-09-28 DIAGNOSIS — W000XXA Fall on same level due to ice and snow, initial encounter: Secondary | ICD-10-CM | POA: Insufficient documentation

## 2020-09-28 DIAGNOSIS — S42209A Unspecified fracture of upper end of unspecified humerus, initial encounter for closed fracture: Secondary | ICD-10-CM

## 2020-09-28 DIAGNOSIS — F17210 Nicotine dependence, cigarettes, uncomplicated: Secondary | ICD-10-CM | POA: Diagnosis not present

## 2020-09-28 DIAGNOSIS — Z7984 Long term (current) use of oral hypoglycemic drugs: Secondary | ICD-10-CM | POA: Insufficient documentation

## 2020-09-28 DIAGNOSIS — Z888 Allergy status to other drugs, medicaments and biological substances status: Secondary | ICD-10-CM | POA: Diagnosis not present

## 2020-09-28 DIAGNOSIS — S42292A Other displaced fracture of upper end of left humerus, initial encounter for closed fracture: Secondary | ICD-10-CM | POA: Diagnosis present

## 2020-09-28 DIAGNOSIS — Z79899 Other long term (current) drug therapy: Secondary | ICD-10-CM | POA: Insufficient documentation

## 2020-09-28 DIAGNOSIS — Z419 Encounter for procedure for purposes other than remedying health state, unspecified: Secondary | ICD-10-CM

## 2020-09-28 DIAGNOSIS — Z7982 Long term (current) use of aspirin: Secondary | ICD-10-CM | POA: Diagnosis not present

## 2020-09-28 DIAGNOSIS — Z79890 Hormone replacement therapy (postmenopausal): Secondary | ICD-10-CM | POA: Diagnosis not present

## 2020-09-28 HISTORY — PX: ORIF HUMERUS FRACTURE: SHX2126

## 2020-09-28 LAB — GLUCOSE, CAPILLARY
Glucose-Capillary: 144 mg/dL — ABNORMAL HIGH (ref 70–99)
Glucose-Capillary: 115 mg/dL — ABNORMAL HIGH (ref 70–99)

## 2020-09-28 LAB — SARS CORONAVIRUS 2 (TAT 6-24 HRS): SARS Coronavirus 2: NEGATIVE

## 2020-09-28 SURGERY — OPEN REDUCTION INTERNAL FIXATION (ORIF) PROXIMAL HUMERUS FRACTURE
Anesthesia: General | Laterality: Left

## 2020-09-28 MED ORDER — EPHEDRINE SULFATE-NACL 50-0.9 MG/10ML-% IV SOSY
PREFILLED_SYRINGE | INTRAVENOUS | Status: DC | PRN
  Administered 2020-09-28: 10 mg via INTRAVENOUS
  Administered 2020-09-28: 5 mg via INTRAVENOUS

## 2020-09-28 MED ORDER — MIDAZOLAM HCL 2 MG/2ML IJ SOLN
INTRAMUSCULAR | Status: AC
  Administered 2020-09-28: 1 mg via INTRAVENOUS
  Filled 2020-09-28: qty 2

## 2020-09-28 MED ORDER — TRANEXAMIC ACID-NACL 1000-0.7 MG/100ML-% IV SOLN
INTRAVENOUS | Status: AC
  Filled 2020-09-28: qty 100

## 2020-09-28 MED ORDER — OXYCODONE-ACETAMINOPHEN 5-325 MG PO TABS: 1.0000 | ORAL_TABLET | ORAL | 0 refills | Status: AC | PRN

## 2020-09-28 MED ORDER — EPHEDRINE 5 MG/ML INJ
INTRAVENOUS | Status: AC
  Filled 2020-09-28: qty 10

## 2020-09-28 MED ORDER — SUGAMMADEX SODIUM 200 MG/2ML IV SOLN
INTRAVENOUS | Status: DC | PRN
  Administered 2020-09-28: 200 mg via INTRAVENOUS

## 2020-09-28 MED ORDER — TRANEXAMIC ACID-NACL 1000-0.7 MG/100ML-% IV SOLN
1000.0000 mg | INTRAVENOUS | Status: AC
  Administered 2020-09-28: 1000 mg via INTRAVENOUS

## 2020-09-28 MED ORDER — CEFAZOLIN SODIUM-DEXTROSE 2-4 GM/100ML-% IV SOLN
2.0000 g | INTRAVENOUS | Status: AC
  Administered 2020-09-28: 2 g via INTRAVENOUS

## 2020-09-28 MED ORDER — BUPIVACAINE-EPINEPHRINE (PF) 0.5% -1:200000 IJ SOLN
INTRAMUSCULAR | Status: DC | PRN
Start: 2020-09-28 — End: 2020-09-28
  Administered 2020-09-28: 17 mL via PERINEURAL

## 2020-09-28 MED ORDER — CHLORHEXIDINE GLUCONATE 0.12 % MT SOLN: 15.0000 mL | Freq: Once | OROMUCOSAL | Status: AC

## 2020-09-28 MED ORDER — FENTANYL CITRATE (PF) 100 MCG/2ML IJ SOLN: 25.0000 ug | INTRAMUSCULAR | Status: DC | PRN

## 2020-09-28 MED ORDER — CEFAZOLIN SODIUM-DEXTROSE 2-4 GM/100ML-% IV SOLN
INTRAVENOUS | Status: AC
  Filled 2020-09-28: qty 100

## 2020-09-28 MED ORDER — ONDANSETRON HCL 4 MG/2ML IJ SOLN
INTRAMUSCULAR | Status: DC | PRN
  Administered 2020-09-28: 4 mg via INTRAVENOUS

## 2020-09-28 MED ORDER — PROPOFOL 10 MG/ML IV BOLUS
INTRAVENOUS | Status: DC | PRN
  Administered 2020-09-28: 160 mg via INTRAVENOUS
  Administered 2020-09-28: 40 mg via INTRAVENOUS

## 2020-09-28 MED ORDER — LIDOCAINE 2% (20 MG/ML) 5 ML SYRINGE
INTRAMUSCULAR | Status: AC
  Filled 2020-09-28: qty 5

## 2020-09-28 MED ORDER — ROCURONIUM BROMIDE 10 MG/ML (PF) SYRINGE
PREFILLED_SYRINGE | INTRAVENOUS | Status: AC
  Filled 2020-09-28: qty 10

## 2020-09-28 MED ORDER — ORAL CARE MOUTH RINSE: 15.0000 mL | Freq: Once | OROMUCOSAL | Status: AC

## 2020-09-28 MED ORDER — ONDANSETRON HCL 4 MG/2ML IJ SOLN: 4.0000 mg | Freq: Once | INTRAMUSCULAR | Status: DC | PRN

## 2020-09-28 MED ORDER — PHENYLEPHRINE 40 MCG/ML (10ML) SYRINGE FOR IV PUSH (FOR BLOOD PRESSURE SUPPORT)
PREFILLED_SYRINGE | INTRAVENOUS | Status: DC | PRN
  Administered 2020-09-28: 120 ug via INTRAVENOUS
  Administered 2020-09-28: 160 ug via INTRAVENOUS
  Administered 2020-09-28: 120 ug via INTRAVENOUS

## 2020-09-28 MED ORDER — LIDOCAINE 2% (20 MG/ML) 5 ML SYRINGE
INTRAMUSCULAR | Status: DC | PRN
  Administered 2020-09-28: 70 mg via INTRAVENOUS

## 2020-09-28 MED ORDER — PHENYLEPHRINE 40 MCG/ML (10ML) SYRINGE FOR IV PUSH (FOR BLOOD PRESSURE SUPPORT)
PREFILLED_SYRINGE | INTRAVENOUS | Status: AC
  Filled 2020-09-28: qty 10

## 2020-09-28 MED ORDER — ROCURONIUM BROMIDE 100 MG/10ML IV SOLN
INTRAVENOUS | Status: DC | PRN
  Administered 2020-09-28: 60 mg via INTRAVENOUS

## 2020-09-28 MED ORDER — ONDANSETRON HCL 4 MG/2ML IJ SOLN
INTRAMUSCULAR | Status: AC
  Filled 2020-09-28: qty 2

## 2020-09-28 MED ORDER — OXYCODONE HCL 5 MG PO TABS: 5.0000 mg | ORAL_TABLET | Freq: Once | ORAL | Status: DC | PRN

## 2020-09-28 MED ORDER — PROPOFOL 10 MG/ML IV BOLUS
INTRAVENOUS | Status: AC
  Filled 2020-09-28: qty 40

## 2020-09-28 MED ORDER — LACTATED RINGERS IV SOLN: INTRAVENOUS | Status: DC

## 2020-09-28 MED ORDER — POVIDONE-IODINE 10 % EX SWAB
2.0000 "application " | Freq: Once | CUTANEOUS | Status: AC
  Administered 2020-09-28: 2 via TOPICAL

## 2020-09-28 MED ORDER — METHOCARBAMOL 500 MG PO TABS: 500.0000 mg | ORAL_TABLET | Freq: Four times a day (QID) | ORAL | 0 refills | Status: DC | PRN

## 2020-09-28 MED ORDER — 0.9 % SODIUM CHLORIDE (POUR BTL) OPTIME
TOPICAL | Status: DC | PRN
  Administered 2020-09-28: 1000 mL

## 2020-09-28 MED ORDER — PHENYLEPHRINE HCL-NACL 10-0.9 MG/250ML-% IV SOLN
INTRAVENOUS | Status: DC | PRN
  Administered 2020-09-28: 40 ug/min via INTRAVENOUS

## 2020-09-28 MED ORDER — CHLORHEXIDINE GLUCONATE 0.12 % MT SOLN
OROMUCOSAL | Status: AC
  Administered 2020-09-28: 15 mL via OROMUCOSAL
  Filled 2020-09-28: qty 15

## 2020-09-28 MED ORDER — VANCOMYCIN HCL 1000 MG IV SOLR
INTRAVENOUS | Status: AC
  Filled 2020-09-28: qty 1000

## 2020-09-28 MED ORDER — MIDAZOLAM HCL 2 MG/2ML IJ SOLN: 1.0000 mg | Freq: Once | INTRAMUSCULAR | Status: AC

## 2020-09-28 MED ORDER — OXYCODONE HCL 5 MG/5ML PO SOLN: 5.0000 mg | Freq: Once | ORAL | Status: DC | PRN

## 2020-09-28 MED ORDER — FENTANYL CITRATE (PF) 250 MCG/5ML IJ SOLN
INTRAMUSCULAR | Status: AC
  Filled 2020-09-28: qty 5

## 2020-09-28 MED ORDER — FENTANYL CITRATE (PF) 100 MCG/2ML IJ SOLN
INTRAMUSCULAR | Status: AC
  Administered 2020-09-28: 50 ug via INTRAVENOUS
  Filled 2020-09-28: qty 2

## 2020-09-28 MED ORDER — BUPIVACAINE LIPOSOME 1.3 % IJ SUSP
INTRAMUSCULAR | Status: DC | PRN
  Administered 2020-09-28: 10 mL via PERINEURAL

## 2020-09-28 MED ORDER — FENTANYL CITRATE (PF) 250 MCG/5ML IJ SOLN
INTRAMUSCULAR | Status: DC | PRN
  Administered 2020-09-28 (×2): 50 ug via INTRAVENOUS

## 2020-09-28 MED ORDER — FENTANYL CITRATE (PF) 100 MCG/2ML IJ SOLN: 50.0000 ug | Freq: Once | INTRAMUSCULAR | Status: AC

## 2020-09-28 SURGICAL SUPPLY — 66 items
BAG DECANTER FOR FLEXI CONT (MISCELLANEOUS) ×2 IMPLANT
BIT DRILL 2.5 (BIT) ×2
BIT DRILL 3.1 (BIT) ×2 IMPLANT
BIT DRILL SHRT 216X2.5XNS LF (BIT) ×1 IMPLANT
BIT DRL SHRT 216X2.5XNS LF (BIT) ×1
BLADE CLIPPER SURG (BLADE) ×2 IMPLANT
CHLORAPREP W/TINT 26 (MISCELLANEOUS) ×4 IMPLANT
CLSR STERI-STRIP ANTIMIC 1/2X4 (GAUZE/BANDAGES/DRESSINGS) ×2 IMPLANT
COVER SURGICAL LIGHT HANDLE (MISCELLANEOUS) ×2 IMPLANT
COVER WAND RF STERILE (DRAPES) ×2 IMPLANT
DRAPE C-ARM 42X72 X-RAY (DRAPES) ×2 IMPLANT
DRAPE IMP U-DRAPE 54X76 (DRAPES) ×2 IMPLANT
DRAPE INCISE IOBAN 66X45 STRL (DRAPES) ×2 IMPLANT
DRAPE ORTHO SPLIT 77X108 STRL (DRAPES) ×4
DRAPE SURG 17X23 STRL (DRAPES) ×2 IMPLANT
DRAPE SURG ORHT 6 SPLT 77X108 (DRAPES) ×2 IMPLANT
DRAPE U-SHAPE 47X51 STRL (DRAPES) ×2 IMPLANT
DRESSING AQUACEL AG SP 3.5X6 (GAUZE/BANDAGES/DRESSINGS) ×2 IMPLANT
DRSG AQUACEL AG ADV 3.5X10 (GAUZE/BANDAGES/DRESSINGS) ×2 IMPLANT
DRSG AQUACEL AG SP 3.5X6 (GAUZE/BANDAGES/DRESSINGS) ×4
ELECT REM PT RETURN 9FT ADLT (ELECTROSURGICAL) ×2
ELECTRODE REM PT RTRN 9FT ADLT (ELECTROSURGICAL) ×1 IMPLANT
GLOVE SRG 8 PF TXTR STRL LF DI (GLOVE) ×2 IMPLANT
GLOVE SURG SYN 7.5  E (GLOVE) ×2
GLOVE SURG SYN 7.5 E (GLOVE) ×1 IMPLANT
GLOVE SURG UNDER POLY LF SZ8 (GLOVE) ×4
GOWN STRL REUS W/ TWL LRG LVL3 (GOWN DISPOSABLE) ×2 IMPLANT
GOWN STRL REUS W/TWL LRG LVL3 (GOWN DISPOSABLE) ×4
K-WIRE 2.0 (WIRE) ×6
K-WIRE FX234X2XDRL TIP NS (WIRE) ×3
KIT BASIN OR (CUSTOM PROCEDURE TRAY) ×2 IMPLANT
KIT TURNOVER KIT B (KITS) ×2 IMPLANT
KWIRE FX234X2XDRL TIP NS (WIRE) ×3 IMPLANT
MANIFOLD NEPTUNE II (INSTRUMENTS) ×2 IMPLANT
NS IRRIG 1000ML POUR BTL (IV SOLUTION) ×2 IMPLANT
PACK SHOULDER (CUSTOM PROCEDURE TRAY) ×2 IMPLANT
PAD ARMBOARD 7.5X6 YLW CONV (MISCELLANEOUS) ×4 IMPLANT
PLATE PROX HUMERUS 4H (Plate) ×2 IMPLANT
RESTRAINT HEAD UNIVERSAL NS (MISCELLANEOUS) ×2 IMPLANT
SCREW BONE CANC 4.0X40MM (Screw) ×2 IMPLANT
SCREW BONE CORT 3.5X24MM (Screw) ×4 IMPLANT
SCREW BONE LOCK 4.0X24MM (Screw) ×2 IMPLANT
SCREW BONE LOCKING 4.0X42MM (Screw) ×4 IMPLANT
SCREW BONE LOCKING 4X38MM S/T (Screw) ×6 IMPLANT
SCREW CORTICAL 3.5MMX26MM (Screw) ×2 IMPLANT
SCREW LOCK 32X4XSTNS CORT (Screw) ×1 IMPLANT
SCREW LOCKING 4.0MMX36MM (Screw) ×2 IMPLANT
SCREW LOCKING 4X32 (Screw) ×2 IMPLANT
SLING ARM IMMOBILIZER LRG (SOFTGOODS) ×2 IMPLANT
SPONGE LAP 18X18 RF (DISPOSABLE) ×2 IMPLANT
STRIP CLOSURE SKIN 1/2X4 (GAUZE/BANDAGES/DRESSINGS) ×4 IMPLANT
SUCTION FRAZIER HANDLE 10FR (MISCELLANEOUS) ×2
SUCTION TUBE FRAZIER 10FR DISP (MISCELLANEOUS) ×1 IMPLANT
SUT MNCRL AB 4-0 PS2 18 (SUTURE) ×2 IMPLANT
SUT MON AB 2-0 CT1 36 (SUTURE) ×2 IMPLANT
SUT TIGER TAPE 7 IN WHITE (SUTURE) ×2 IMPLANT
SUT VIC AB 0 CT1 27 (SUTURE) ×2
SUT VIC AB 0 CT1 27XBRD ANBCTR (SUTURE) ×1 IMPLANT
SUT VIC AB 2-0 CT1 27 (SUTURE) ×4
SUT VIC AB 2-0 CT1 TAPERPNT 27 (SUTURE) ×2 IMPLANT
SUTURE TAPE 1.3 40 TPR END (SUTURE) ×3 IMPLANT
SUTURETAPE 1.3 40 TPR END (SUTURE) ×6
SUTURETAPE 1.3 40 W/NDL BLK/WH (SUTURE) ×2 IMPLANT
TOWEL GREEN STERILE (TOWEL DISPOSABLE) ×2 IMPLANT
TOWEL GREEN STERILE FF (TOWEL DISPOSABLE) ×2 IMPLANT
WATER STERILE IRR 1000ML POUR (IV SOLUTION) ×2 IMPLANT

## 2020-09-28 NOTE — Discharge Instructions (Signed)
Discharge Instructions  - Keep dressings in place for the next 5-7 days. You may shower over top of the current dressing. Once the surgical dressing has been removed, the incision may get wet as long as it is without drainage. Change dressing then daily with clean gauze and tape - Take all medication as prescribed. Transition to over the counter pain medication as your pain improves - Keep the shoulder elevated over the next 48-72 hours to help with pain and swelling - Ice the shoulder regularly to help with pain and swelling - Perform pendulum excercises to the shoulder 2-3 times a day. Be sure to move the elbow, wrist and fingers to prevent swelling and stiffness - Please call to schedule a follow up appointment with Dr. Kerstie Agent at [phone] for 10-14 days following surgery  

## 2020-09-28 NOTE — Anesthesia Preprocedure Evaluation (Signed)
Anesthesia Evaluation  Patient identified by MRN, date of birth, ID band Patient awake    Reviewed: Allergy & Precautions, NPO status , Patient's Chart, lab work & pertinent test results, reviewed documented beta blocker date and time   History of Anesthesia Complications (+) Family history of anesthesia reaction  Airway Mallampati: II  TM Distance: >3 FB Neck ROM: Full    Dental no notable dental hx. (+) Teeth Intact   Pulmonary neg pulmonary ROS, Current Smoker and Patient abstained from smoking.,    Pulmonary exam normal breath sounds clear to auscultation       Cardiovascular hypertension, Pt. on medications and Pt. on home beta blockers Normal cardiovascular exam Rhythm:Regular Rate:Normal     Neuro/Psych negative neurological ROS  negative psych ROS   GI/Hepatic negative GI ROS, Neg liver ROS,   Endo/Other  diabetes, Well Controlled, Type 2, Oral Hypoglycemic AgentsHypothyroidism Hyperlipidemia  Renal/GU negative Renal ROS  negative genitourinary   Musculoskeletal  (+) Arthritis , Osteoarthritis,  Left Proximal humerus Fx   Abdominal   Peds  Hematology negative hematology ROS (+)   Anesthesia Other Findings   Reproductive/Obstetrics                             Anesthesia Physical Anesthesia Plan  ASA: III  Anesthesia Plan: General   Post-op Pain Management:    Induction: Intravenous  PONV Risk Score and Plan: 3 and Treatment may vary due to age or medical condition, Ondansetron and Midazolam  Airway Management Planned: Oral ETT  Additional Equipment:   Intra-op Plan:   Post-operative Plan: Extubation in OR  Informed Consent: I have reviewed the patients History and Physical, chart, labs and discussed the procedure including the risks, benefits and alternatives for the proposed anesthesia with the patient or authorized representative who has indicated his/her  understanding and acceptance.     Dental advisory given  Plan Discussed with: CRNA and Anesthesiologist  Anesthesia Plan Comments:         Anesthesia Quick Evaluation

## 2020-09-28 NOTE — Anesthesia Procedure Notes (Signed)
Procedure Name: Intubation Date/Time: 09/28/2020 9:32 AM Performed by: Hendricks Limes, CRNA Pre-anesthesia Checklist: Patient identified, Emergency Drugs available, Suction available and Patient being monitored Patient Re-evaluated:Patient Re-evaluated prior to induction Oxygen Delivery Method: Circle system utilized Preoxygenation: Pre-oxygenation with 100% oxygen Induction Type: IV induction Ventilation: Mask ventilation without difficulty Laryngoscope Size: Miller and 2 Grade View: Grade I Tube type: Oral Tube size: 7.0 mm Number of attempts: 1 Airway Equipment and Method: Stylet and Oral airway Placement Confirmation: ETT inserted through vocal cords under direct vision,  positive ETCO2 and breath sounds checked- equal and bilateral Secured at: 21 cm Tube secured with: Tape Dental Injury: Teeth and Oropharynx as per pre-operative assessment

## 2020-09-28 NOTE — Anesthesia Procedure Notes (Signed)
Anesthesia Regional Block: Interscalene brachial plexus block   Pre-Anesthetic Checklist: ,, timeout performed, Correct Patient, Correct Site, Correct Laterality, Correct Procedure, Correct Position, site marked, Risks and benefits discussed,  Surgical consent,  Pre-op evaluation,  At surgeon's request and post-op pain management  Laterality: Left  Prep: chloraprep       Needles:  Injection technique: Single-shot  Needle Type: Echogenic Stimulator Needle     Needle Length: 9cm  Needle Gauge: 21   Needle insertion depth: 7 cm   Additional Needles:   Procedures:,,,, ultrasound used (permanent image in chart),,,,  Narrative:  Start time: 09/28/2020 8:28 AM End time: 09/28/2020 8:33 AM Injection made incrementally with aspirations every 5 mL.  Performed by: Personally  Anesthesiologist: Josephine Igo, MD  Additional Notes: Timeout performed. Patient sedated. Relevant anatomy ID'd using [place]. Incremental 2-74ml injection of LA with frequent aspiration. Patient tolerated procedure well.        Left Interscalene Block

## 2020-09-28 NOTE — Transfer of Care (Signed)
Immediate Anesthesia Transfer of Care Note  Patient: Leslie Potts  Procedure(s) Performed: OPEN REDUCTION INTERNAL FIXATION (ORIF) PROXIMAL HUMERUS FRACTURE (Left )  Patient Location: PACU  Anesthesia Type:General and Regional  Level of Consciousness: awake, alert , oriented and patient cooperative  Airway & Oxygen Therapy: Patient Spontanous Breathing and Patient connected to face mask oxygen  Post-op Assessment: Report given to RN, Post -op Vital signs reviewed and stable and Patient moving all extremities  Post vital signs: Reviewed and stable  Last Vitals:  Vitals Value Taken Time  BP 110/68 09/28/20 1138  Temp    Pulse 84 09/28/20 1139  Resp 22 09/28/20 1139  SpO2 100 % 09/28/20 1139  Vitals shown include unvalidated device data.  Last Pain:  Vitals:   09/28/20 0835  TempSrc:   PainSc: 0-No pain      Patients Stated Pain Goal: 3 (24/82/50 0370)  Complications: No complications documented.

## 2020-09-28 NOTE — Anesthesia Postprocedure Evaluation (Addendum)
Anesthesia Post Note  Patient: Leslie Potts  Procedure(s) Performed: OPEN REDUCTION INTERNAL FIXATION (ORIF) PROXIMAL HUMERUS FRACTURE (Left )     Patient location during evaluation: PACU Anesthesia Type: General Level of consciousness: awake and alert and oriented Pain management: pain level controlled Vital Signs Assessment: post-procedure vital signs reviewed and stable Respiratory status: spontaneous breathing, nonlabored ventilation and respiratory function stable Cardiovascular status: blood pressure returned to baseline and stable Postop Assessment: no apparent nausea or vomiting Anesthetic complications: no   No complications documented.  Last Vitals:  Vitals:   09/28/20 1152 09/28/20 1207  BP: (!) 111/59 117/65  Pulse: 85 84  Resp: 14 18  Temp:  36.7 C  SpO2: 98% 96%    Last Pain:  Vitals:   09/28/20 1207  TempSrc:   PainSc: 0-No pain                 Deisha Stull A.

## 2020-09-28 NOTE — H&P (Signed)
ORTHOPAEDIC H&P  PCP:  Holland Commons, FNP  Chief Complaint: Left proximal humerus fracture  HPI: Leslie Potts is a 60 y.o. female who complains of left shoulder pain.  Last week she slipped and fell on ice and landed onto the left shoulder.  She was seen in our urgent care clinic where she was found to have a displaced three-part proximal humerus fracture.  She was placed into a sling.  Based on the amount of angulation of her proximal humerus fracture, I did recommend proceeding forward with surgical fixation in terms of an open reduction and internal fixation with possible reverse shoulder arthroplasty.  She presents today for operative management.  She is remained n.p.o.  She denies any numbness or tingling in the hand.  She still is having some swelling and pain in the hand and wrist.    Past Medical History:  Diagnosis Date  . Arthritis   . Diabetes mellitus without complication ([place])   . Family history of adverse reaction to anesthesia    her mother hard time waking up   . Hypertension   . Hypothyroidism    Past Surgical History:  Procedure Laterality Date  . KNEE ARTHROSCOPY Left    Social History   Socioeconomic History  . Marital status: Married    Spouse name: Not on file  . Number of children: Not on file  . Years of education: Not on file  . Highest education level: Not on file  Occupational History  . Not on file  Tobacco Use  . Smoking status: Current Every Day Smoker    Packs/day: 0.25    Years: 30.00    Pack years: 7.50    Types: Cigarettes  . Smokeless tobacco: Never Used  . Tobacco comment: states she doesn't smoke much  Substance and Sexual Activity  . Alcohol use: Yes    Alcohol/week: 3.0 - 4.0 standard drinks    Types: 3 - 4 Cans of beer per week  . Drug use: Never  . Sexual activity: Not on file  Other Topics Concern  . Not on file  Social History Narrative  . Not on file   Social Determinants of Health   Financial Resource  Strain: Not on file  Food Insecurity: Not on file  Transportation Needs: Not on file  Physical Activity: Not on file  Stress: Not on file  Social Connections: Not on file   Family History  Problem Relation Age of Onset  . Breast cancer Sister    Allergies  Allergen Reactions  . Meloxicam Rash   Prior to Admission medications   Medication Sig Start Date End Date Taking? Authorizing Provider  atorvastatin (LIPITOR) 40 MG tablet Take 40 mg by mouth every evening. 08/18/20  Yes [provider]  carvedilol (COREG) 12.5 MG tablet Take 12.5 mg by mouth in the morning and at bedtime. 07/17/14  Yes [provider]  Cholecalciferol (VITAMIN D3) 50 MCG (2000 UT) TABS Take 4,000 Units by mouth every evening.   Yes [provider]  fenofibrate (TRICOR) 145 MG tablet Take 145 mg by mouth daily. 04/03/20  Yes [provider]  levothyroxine (SYNTHROID) 112 MCG tablet Take 112 mcg by mouth daily. 08/21/20  Yes [provider]  Lysine HCl 1000 MG TABS Take 1,000 mg by mouth daily.   Yes [provider]  metFORMIN (GLUCOPHAGE) 1000 MG tablet Take 1,000 mg by mouth 2 (two) times daily. 04/15/20  Yes [provider]  Multiple Vitamin (  MULTIVITAMIN WITH MINERALS) TABS tablet Take 1 tablet by mouth every evening.   Yes [provider]  Naphazoline-Pheniramine (OPCON-A) 0.027-0.315 % SOLN Place 1 drop into both eyes daily. Allergy relief   Yes [provider]  Omega-3 Fatty Acids (FISH OIL PO) Take 1 capsule by mouth daily.   Yes [provider]  pioglitazone (ACTOS) 30 MG tablet Take 30 mg by mouth daily. 03/31/20  Yes [provider]  Semaglutide,0.25 or 0.5MG /DOS, (OZEMPIC, 0.25 OR 0.5 MG/DOSE,) 2 MG/1.5ML SOPN Inject 0.5 mg into the skin every Wednesday.   Yes [provider]  Zinc 50 MG TABS Take 50 mg by mouth daily.   Yes [provider]  aspirin 325 MG tablet Take 650 mg by mouth daily.     [provider]   No results found.  Positive ROS: All other systems have been reviewed and were otherwise negative with the exception of those mentioned in the HPI and as above.  Physical Exam: General: Alert, no acute distress Cardiovascular: No edema Respiratory: No cyanosis, no use of accessory musculature Skin: No lesions in the area of chief complaint  Psychiatric: Patient is competent for consent with normal mood and affect  MUSCULOSKELETAL: Tenderness palpation around the left shoulder.  Decreased active and passive left shoulder range of motion secondary to pain.  Intact sensation to the lateral aspect of the shoulder as well as in the digits throughout.  Full range of motion of the fingers and wrist.  Fingertips are warm well perfused with brisk capillary refill.  Tenderness to palpation around the left wrist  Assessment: #1 left displaced 2 part proximal humerus fracture  Plan: Plan to proceed forward with open reduction and internal fixation of the left proximal humerus.  We will have reverse shoulder arthroplasty available should that be needed.  The risks of surgery were extensively discussed with the patient.  These risks include but not limited to infection, bleeding, damage to surrounding structures including blood vessels and nerves, pain, stiffness, malunion, nonunion, avascular necrosis, implant failure and need for additional procedures.  Informed consent was obtained the patient's left shoulder was marked.  Plan for observation overnight with potential discharge from PACU pending her intraoperative progress.    Verner Mould, MD [phone]   09/28/2020 8:47 AM

## 2020-09-28 NOTE — Op Note (Addendum)
PREOPERATIVE DIAGNOSIS: Displaced left 2 part proximal humerus fracture  POSTOPERATIVE DIAGNOSIS: Same  ATTENDING PHYSICIAN: Maudry Mayhew. Gretta Began, MD who was present and scrubbed for the entire case   ASSISTANT SURGEON: April Green RNFA  ANESTHESIA: General with regional  SURGICAL PROCEDURES: Open reduction and internal fixation of displaced left 2 part proximal humerus fracture  SURGICAL INDICATIONS: Patient 60 year old female who last week slipped and fell on ice.  She landed directly onto the left shoulder.  She was seen in our urgent care where she was found to have displaced left 2 part proximal humerus fracture with a fracture through the anatomic neck.  We discussed treatment options and she did wish to proceed forward with surgical fixation of her fracture due to the amount of displacement and young age.  After discussing risk and benefits of surgery she did wish to proceed and presents today for that.  She did have a preoperative CT scan which confirmed 2 part proximal humerus fracture.  There were several small nondisplaced fracture lines into including the tuberosities as well as the humeral head but the decision was still made to proceed forward with surgical fixation of her fracture.  FINDING: Comminuted 2 part proximal humerus fracture was anatomically reduced and stable fixation was achieved using a Stryker proximal humerus locking plate.  DESCRIPTION OF PROCEDURE: Patient was identified in the preop holding area where the risk benefits and alternatives of the procedure were discussed with the patient.  These risks include but not limited to infection, bleeding, damage to surrounding structures including blood vessels and nerves, pain, stiffness, malunion, nonunion, avascular necrosis, implant failure need for additional procedures.  Informed consent was obtained at that time the patient's left shoulder was marked.  She then underwent a left upper extremity plexus block by  anesthesia.  She was then brought to the operative suite where timeout was performed identifying the correct patient operative site.  She was positioned supine on the operative table and induced under general anesthesia.  All bony prominences were well-padded.  Preoperative antibiotics as well as TXA were administered.  The patient's head was then elevated approximately 30 degrees.  The left shoulder and arm were then prepped and draped in usual sterile fashion.  Standard deltopectoral approach was utilized.  Incision was made in the anterior aspect of the shoulder.  Bovie electrocautery was used to dissect through the subcutaneous tissues.  The cephalic vein was visualized in the deltopectoral interval.  Careful dissection of the vein was performed which was then mobilized laterally.  The clavipectoral fascia was then opened and the proximal humerus was exposed.  Retractor was placed medially protecting the conjoined tendon.  Overlying bursal tissue was excised while taking care to protect the rotator cuff throughout.  The biceps tendon was visualized and protected within the bicipital groove.  There was a comminuted fracture through the anatomic neck of the proximal humerus.  Manipulation of the fracture was performed and 2 K wires were placed through the humeral shaft and into the head to provide temporary fixation.  This was confirmed to be in good alignment in both AP and lateral planes under fluoroscopic images.  Reduction was performed with longitudinal traction on the arm as well as posterior translation of the humeral shaft.  The Stryker proximal humerus locking plate was then positioned on the lateral aspect of the proximal humerus.  This was seated proximal 1cm distal to the tip of the greater tuberosity.  Single cortical screw was placed in the oblong hole distally  to allow for further manipulation and alignment of the plate.  Once in good position K wires were placed to help stabilize the plate.   Multiple locking screws were placed the proximal aspect of the plate and into the humeral head.  Care was taken to make sure these were unicortical and not exiting out into the glenohumeral joint.  Fluoroscopic images again were obtained which showed extra-articular screws throughout with maintained, near-anatomic reduction of the fracture.  Once all proximal screws were placed, locking and nonlocking screws were placed distally into the humeral shaft in standard fashion.  Once again fluoroscopic images were taken which showed near-anatomic reduction of the fracture as well as extra-articular screw placement and appropriate positioning of the of the plate on the lateral aspect of the proximal humerus.  At this point fiber tape sutures were passed into the subscapularis, supraspinatus and infraspinatus tendons.  These sutures were then passed through the plate and tied down over top to help provide additional support and stability to the tuberosities and prevent displacement.  The shoulder was then taken through series range of motion.  She had full passive range of motion including forward elevation, external rotation and internal rotation with no crepitus or evidence of impingement.  At this point the wound was copiously irrigated with normal saline.  Topical vancomycin powder was placed into the wound.  The deltopectoral interval was closed with a running 0 Vicryl suture.  The skin was closed with deep 2-0 Vicryl sutures followed by running 4-0 Monocryl suture.  Steri-Strips and a Aquasol dressing were then applied.  The arm was placed into a sling and the patient was awoken from her anesthesia and extubated in the operating room without any complications.  She was taken to PACU in stable condition.  She tolerated the procedure well and there are no complications.  RADIOGRAPHIC INTERPRETATION: AP and lateral radiographs of the proximal humerus were obtained intraoperative under fluoroscopic images.  This shows  anatomic reduction of the proximal humerus fracture with plate and screw construct in place laterally.  All screws were extra-articular.  No other fractures or dislocations are noted.  ESTIMATED BLOOD LOSS: 50 mL  SPECIMENS: None  POSTOPERATIVE PLAN: The patient will be discharged home and seen back  in the office in approximately 10-12 days for wound check, and radiographs of the left shoulder.  We can begin some gentle pendulum exercises with therapy at her 2-week appointment.  We will hold on significant range of motion exercises to her left shoulder until approximately 6 weeks postop.  Strengthening at 3 months postop  IMPLANTS: Stryker proximal humerus locking plate and screw construct.

## 2020-10-03 ENCOUNTER — Encounter (HOSPITAL_COMMUNITY): Payer: Self-pay | Admitting: Orthopaedic Surgery

## 2021-01-05 ENCOUNTER — Other Ambulatory Visit: Payer: Self-pay | Admitting: Internal Medicine

## 2021-01-05 DIAGNOSIS — Z1231 Encounter for screening mammogram for malignant neoplasm of breast: Secondary | ICD-10-CM

## 2021-03-02 ENCOUNTER — Ambulatory Visit
Admission: RE | Admit: 2021-03-02 | Discharge: 2021-03-02 | Disposition: A | Discharge status: Home / Self Care | Payer: 59 | Source: Ambulatory Visit | Attending: Internal Medicine | Admitting: Internal Medicine

## 2021-03-02 ENCOUNTER — Other Ambulatory Visit: Payer: Self-pay

## 2021-03-02 DIAGNOSIS — Z1231 Encounter for screening mammogram for malignant neoplasm of breast: Secondary | ICD-10-CM

## 2021-04-20 NOTE — Progress Notes (Signed)
Referring-James Maudie Mercury, MD Reason for referral-coronary artery disease  HPI: 60 year old female for evaluation of coronary artery disease at request of Jani Gravel, MD.  Chest CT November 2021 showed aortic atherosclerosis, calcium in the circumflex.  Patient denies dyspnea, chest pain, palpitations or syncope.  No claudication.  Cardiology now asked to evaluate.  Current Outpatient Medications  Medication Sig Dispense Refill   atorvastatin (LIPITOR) 40 MG tablet Take 40 mg by mouth every evening.     calcium carbonate (OSCAL) 1500 (600 Ca) MG TABS tablet Take 600 mg by mouth 2 (two) times daily.     carvedilol (COREG) 12.5 MG tablet Take 12.5 mg by mouth in the morning and at bedtime.     Cholecalciferol (VITAMIN D3) 50 MCG (2000 UT) TABS Take 4,000 Units by mouth every evening.     fenofibrate (TRICOR) 145 MG tablet Take 145 mg by mouth daily.     levothyroxine (SYNTHROID) 112 MCG tablet Take 112 mcg by mouth daily.     Lysine HCl 1000 MG TABS Take 1,000 mg by mouth daily.     metFORMIN (GLUCOPHAGE) 1000 MG tablet Take 1,000 mg by mouth 2 (two) times daily.     Multiple Vitamin (MULTIVITAMIN WITH MINERALS) TABS tablet Take 1 tablet by mouth every evening.     Naphazoline-Pheniramine (OPCON-A) 0.027-0.315 % SOLN Place 1 drop into both eyes daily. Allergy relief     Omega-3 Fatty Acids (FISH OIL PO) Take 1 capsule by mouth daily.     Semaglutide,0.25 or 0.'5MG'$ /DOS, (OZEMPIC, 0.25 OR 0.5 MG/DOSE,) 2 MG/1.5ML SOPN Inject 0.5 mg into the skin every Wednesday.     Zinc 50 MG TABS Take 50 mg by mouth daily.     aspirin 325 MG tablet Take 650 mg by mouth daily. (Patient not taking: Reported on 05/01/2021)     methocarbamol (ROBAXIN) 500 MG tablet Take 1 tablet (500 mg total) by mouth 4 (four) times daily as needed for muscle spasms. (Patient not taking: Reported on 05/01/2021) 30 tablet 0   oxyCODONE-acetaminophen (PERCOCET) 5-325 MG tablet Take 1-2 tablets by mouth every 4 (four) hours as needed for  severe pain. (Patient not taking: Reported on 05/01/2021) 30 tablet 0   pioglitazone (ACTOS) 30 MG tablet Take 30 mg by mouth daily. (Patient not taking: Reported on 05/01/2021)     No current facility-administered medications for this visit.    Allergies  Allergen Reactions   Meloxicam Rash     Past Medical History:  Diagnosis Date   Arthritis    Diabetes mellitus without complication ([facility])    Family history of adverse reaction to anesthesia    her mother hard time waking up    Hyperlipidemia    Hypertension    Hypothyroidism     Past Surgical History:  Procedure Laterality Date   KNEE ARTHROSCOPY Left    ORIF HUMERUS FRACTURE Left 09/28/2020   Procedure: OPEN REDUCTION INTERNAL FIXATION (ORIF) PROXIMAL HUMERUS FRACTURE;  Surgeon: Verner Mould, MD;  Location: [facility];  Service: Orthopedics;  Laterality: Left;  NEEDS RNFA    Social History   Socioeconomic History   Marital status: Married    Spouse name: Not on file   Number of children: Not on file   Years of education: Not on file   Highest education level: Not on file  Occupational History   Not on file  Tobacco Use   Smoking status: Every Day    Packs/day: 0.25    Years: 30.00  Pack years: 7.50    Types: Cigarettes   Smokeless tobacco: Never   Tobacco comments:    states she doesn't smoke much  Substance and Sexual Activity   Alcohol use: Yes    Alcohol/week: 3.0 - 4.0 standard drinks    Types: 3 - 4 Cans of beer per week    Comment: 2 beverages per night   Drug use: Never   Sexual activity: Not on file  Other Topics Concern   Not on file  Social History Narrative   Not on file   Social Determinants of Health   Financial Resource Strain: Not on file  Food Insecurity: Not on file  Transportation Needs: Not on file  Physical Activity: Not on file  Stress: Not on file  Social Connections: Not on file  Intimate Partner Violence: Not on file    Family History  Problem Relation Age of  Onset   Heart attack Mother    Breast cancer Sister     ROS: no fevers or chills, productive cough, hemoptysis, dysphasia, odynophagia, melena, hematochezia, dysuria, hematuria, rash, seizure activity, orthopnea, PND, pedal edema, claudication. Remaining systems are negative.  Physical Exam:   Blood pressure 122/68, pulse 89, height '5\' 1"'$  (1.549 m), weight 140 lb 9.6 oz (63.8 kg), SpO2 98 %.  General:  Well developed/well nourished in NAD Skin warm/dry Patient not depressed No peripheral clubbing Back-normal HEENT-normal/normal eyelids Neck supple/normal carotid upstroke bilaterally; no bruits; no JVD; no thyromegaly chest - CTA/ normal expansion CV - RRR/normal S1 and S2; no murmurs, rubs or gallops;  PMI nondisplaced Abdomen -NT/ND, no HSM, no mass, + bowel sounds, no bruit 2+ femoral pulses, no bruits Ext-no edema, chords, 2+ DP Neuro-grossly nonfocal  ECG -normal sinus rhythm at a rate of 89, normal axis, no ST changes.  First-degree AV block.  Personally reviewed  A/P  1 coronary artery disease-given coronary calcification noted on previous CT we will treat with aspirin 81 mg daily.  Continue statin.  I will arrange a stress nuclear study to screen for ischemia.  If normal or low risk I have asked her to begin an exercise program.  We discussed risk factor modification.  2 hypertension-patient's blood pressure is controlled.  Continue present medications and follow.  3 hyperlipidemia-continue statin.  She is scheduled to see primary care in November.  I will have her most recent lipids forwarded to [place] at that time.  Goal LDL less than 70.  4 history of diabetes mellitus-Per primary care.  5 tobacco abuse-patient counseled on discontinuing.  Kirk Ruths, MD

## 2021-05-01 ENCOUNTER — Encounter: Payer: Self-pay | Admitting: Cardiology

## 2021-05-01 ENCOUNTER — Other Ambulatory Visit: Payer: Self-pay

## 2021-05-01 ENCOUNTER — Ambulatory Visit (INDEPENDENT_AMBULATORY_CARE_PROVIDER_SITE_OTHER): Payer: 59 | Admitting: Cardiology

## 2021-05-01 VITALS — BP 122/68 | HR 89 | Ht 61.0 in | Wt 140.6 lb

## 2021-05-01 DIAGNOSIS — E7800 Pure hypercholesterolemia, unspecified: Secondary | ICD-10-CM

## 2021-05-01 DIAGNOSIS — I2510 Atherosclerotic heart disease of native coronary artery without angina pectoris: Secondary | ICD-10-CM

## 2021-05-01 DIAGNOSIS — Z720 Tobacco use: Secondary | ICD-10-CM

## 2021-05-01 DIAGNOSIS — I10 Essential (primary) hypertension: Secondary | ICD-10-CM | POA: Diagnosis not present

## 2021-05-01 MED ORDER — ASPIRIN EC 81 MG PO TBEC: 81.0000 mg | DELAYED_RELEASE_TABLET | Freq: Every day | ORAL | 3 refills | Status: DC

## 2021-05-01 NOTE — Patient Instructions (Signed)
Medication Instructions:   REDUCE ASPIRIN TO 81 MG ONCE DAILY  *If you need a refill on your cardiac medications before your next appointment, please call your pharmacy*   Testing/Procedures:  Your physician has requested that you have en exercise stress myoview. For further information please visit [email or web address]. Please follow instruction sheet, as given. [place]   Follow-Up: At [facility], you and your health needs are our priority.  As part of our continuing mission to provide you with exceptional heart care, we have created designated Provider Care Teams.  These Care Teams include your primary Cardiologist (physician) and Advanced Practice Providers (APPs -  Physician Assistants and Nurse Practitioners) who all work together to provide you with the care you need, when you need it.  We recommend signing up for the patient portal called "MyChart".  Sign up information is provided on this After Visit Summary.  MyChart is used to connect with patients for Virtual Visits (Telemedicine).  Patients are able to view lab/test results, encounter notes, upcoming appointments, etc.  Non-urgent messages can be sent to your provider as well.   To learn more about what you can do with MyChart, go to [email or web address].    Your next appointment:   12 month(s)  The format for your next appointment:   In Person  Provider:   Kirk Ruths, MD

## 2021-05-17 ENCOUNTER — Encounter (HOSPITAL_COMMUNITY): Payer: Self-pay | Admitting: *Deleted

## 2021-05-17 ENCOUNTER — Telehealth (HOSPITAL_COMMUNITY): Payer: Self-pay | Admitting: *Deleted

## 2021-05-17 NOTE — Telephone Encounter (Signed)
Left message on voicemail per DPR in reference to upcoming appointment scheduled on 05/23/21 with detailed instructions given per Myocardial Perfusion Study Information Sheet for the test. LM to arrive 15 minutes early, and that it is imperative to arrive on time for appointment to keep from having the test rescheduled. If you need to cancel or reschedule your appointment, please call the office within 24 hours of your appointment. Failure to do so may result in a cancellation of your appointment, and a $50 no show fee. Phone number given for call back for any questions. Kirstie Peri

## 2021-05-23 ENCOUNTER — Ambulatory Visit (HOSPITAL_COMMUNITY): Payer: 59 | Attending: Cardiovascular Disease

## 2021-05-23 ENCOUNTER — Other Ambulatory Visit: Payer: Self-pay

## 2021-05-23 DIAGNOSIS — I2510 Atherosclerotic heart disease of native coronary artery without angina pectoris: Secondary | ICD-10-CM | POA: Diagnosis not present

## 2021-05-23 LAB — MYOCARDIAL PERFUSION IMAGING
Angina Index: 0
Duke Treadmill Score: 8
Estimated workload: 10.1
Exercise duration (min): 8 min
Exercise duration (sec): 15 s
LV dias vol: 37 mL (ref 46–106)
LV sys vol: 9 mL
MPHR: 161 {beats}/min
Nuc Stress EF: 76 %
Peak HR: 139 {beats}/min
Percent HR: 86 %
RPE: 18
Rest HR: 81 {beats}/min
Rest Nuclear Isotope Dose: 10.2 mCi
SDS: 1
SRS: 0
SSS: 1
ST Depression (mm): 0 mm
Stress Nuclear Isotope Dose: 32.7 mCi
TID: 0.93

## 2021-05-23 MED ORDER — TECHNETIUM TC 99M TETROFOSMIN IV KIT
32.7000 | PACK | Freq: Once | INTRAVENOUS | Status: AC | PRN
  Administered 2021-05-23: 32.7 via INTRAVENOUS
  Filled 2021-05-23: qty 33

## 2021-05-23 MED ORDER — TECHNETIUM TC 99M TETROFOSMIN IV KIT
10.2000 | PACK | Freq: Once | INTRAVENOUS | Status: AC | PRN
  Administered 2021-05-23: 10.2 via INTRAVENOUS
  Filled 2021-05-23: qty 11

## 2021-05-28 ENCOUNTER — Encounter: Payer: Self-pay | Admitting: *Deleted

## 2021-07-19 ENCOUNTER — Other Ambulatory Visit: Payer: Self-pay | Admitting: Registered Nurse

## 2021-07-19 DIAGNOSIS — Z720 Tobacco use: Secondary | ICD-10-CM

## 2021-08-23 ENCOUNTER — Ambulatory Visit: Payer: 59

## 2021-09-25 ENCOUNTER — Ambulatory Visit
Admission: RE | Admit: 2021-09-25 | Discharge: 2021-09-25 | Disposition: A | Discharge status: Home / Self Care | Payer: 59 | Source: Ambulatory Visit | Attending: Registered Nurse | Admitting: Registered Nurse

## 2021-09-25 ENCOUNTER — Inpatient Hospital Stay: Admission: RE | Admit: 2021-09-25 | Payer: 59 | Source: Ambulatory Visit

## 2021-09-25 DIAGNOSIS — Z720 Tobacco use: Secondary | ICD-10-CM

## 2022-02-15 ENCOUNTER — Other Ambulatory Visit: Payer: Self-pay | Admitting: Registered Nurse

## 2022-02-15 ENCOUNTER — Other Ambulatory Visit: Payer: Self-pay | Admitting: Family Medicine

## 2022-02-15 DIAGNOSIS — Z1231 Encounter for screening mammogram for malignant neoplasm of breast: Secondary | ICD-10-CM

## 2022-03-04 ENCOUNTER — Ambulatory Visit
Admission: RE | Admit: 2022-03-04 | Discharge: 2022-03-04 | Disposition: A | Discharge status: Home / Self Care | Payer: 59 | Source: Ambulatory Visit | Attending: Registered Nurse | Admitting: Registered Nurse

## 2022-03-04 DIAGNOSIS — Z1231 Encounter for screening mammogram for malignant neoplasm of breast: Secondary | ICD-10-CM

## 2022-03-08 ENCOUNTER — Other Ambulatory Visit: Payer: Self-pay | Admitting: Registered Nurse

## 2022-03-08 DIAGNOSIS — R928 Other abnormal and inconclusive findings on diagnostic imaging of breast: Secondary | ICD-10-CM

## 2022-03-15 ENCOUNTER — Ambulatory Visit
Admission: RE | Admit: 2022-03-15 | Discharge: 2022-03-15 | Disposition: A | Discharge status: Home / Self Care | Payer: 59 | Source: Ambulatory Visit | Attending: Registered Nurse | Admitting: Registered Nurse

## 2022-03-15 ENCOUNTER — Other Ambulatory Visit: Payer: Self-pay | Admitting: Registered Nurse

## 2022-03-15 DIAGNOSIS — R921 Mammographic calcification found on diagnostic imaging of breast: Secondary | ICD-10-CM

## 2022-03-15 DIAGNOSIS — R928 Other abnormal and inconclusive findings on diagnostic imaging of breast: Secondary | ICD-10-CM

## 2022-03-29 ENCOUNTER — Ambulatory Visit
Admission: RE | Admit: 2022-03-29 | Discharge: 2022-03-29 | Disposition: A | Discharge status: Home / Self Care | Payer: 59 | Source: Ambulatory Visit | Attending: Registered Nurse | Admitting: Registered Nurse

## 2022-03-29 DIAGNOSIS — R921 Mammographic calcification found on diagnostic imaging of breast: Secondary | ICD-10-CM

## 2022-03-29 DIAGNOSIS — R928 Other abnormal and inconclusive findings on diagnostic imaging of breast: Secondary | ICD-10-CM

## 2022-04-10 ENCOUNTER — Other Ambulatory Visit (HOSPITAL_COMMUNITY): Payer: Self-pay | Admitting: Surgery

## 2022-04-10 ENCOUNTER — Other Ambulatory Visit: Payer: Self-pay | Admitting: Surgery

## 2022-04-10 DIAGNOSIS — D0582 Other specified type of carcinoma in situ of left breast: Secondary | ICD-10-CM

## 2022-04-14 ENCOUNTER — Ambulatory Visit (HOSPITAL_COMMUNITY)
Admission: RE | Admit: 2022-04-14 | Discharge: 2022-04-14 | Disposition: A | Discharge status: Home / Self Care | Payer: 59 | Source: Ambulatory Visit | Attending: Surgery | Admitting: Surgery

## 2022-04-14 DIAGNOSIS — D0582 Other specified type of carcinoma in situ of left breast: Secondary | ICD-10-CM | POA: Diagnosis present

## 2022-04-14 MED ORDER — GADOBUTROL 1 MMOL/ML IV SOLN
6.0000 mL | Freq: Once | INTRAVENOUS | Status: AC | PRN
  Administered 2022-04-14: 6 mL via INTRAVENOUS

## 2022-04-16 ENCOUNTER — Ambulatory Visit: Payer: Self-pay | Admitting: Surgery

## 2022-04-16 DIAGNOSIS — I2510 Atherosclerotic heart disease of native coronary artery without angina pectoris: Secondary | ICD-10-CM | POA: Insufficient documentation

## 2022-04-16 DIAGNOSIS — I10 Essential (primary) hypertension: Secondary | ICD-10-CM | POA: Insufficient documentation

## 2022-04-16 DIAGNOSIS — E1165 Type 2 diabetes mellitus with hyperglycemia: Secondary | ICD-10-CM | POA: Insufficient documentation

## 2022-04-16 DIAGNOSIS — E119 Type 2 diabetes mellitus without complications: Secondary | ICD-10-CM | POA: Insufficient documentation

## 2022-04-16 DIAGNOSIS — E039 Hypothyroidism, unspecified: Secondary | ICD-10-CM | POA: Insufficient documentation

## 2022-04-16 DIAGNOSIS — E7800 Pure hypercholesterolemia, unspecified: Secondary | ICD-10-CM | POA: Insufficient documentation

## 2022-04-16 NOTE — H&P (Signed)
Subjective  Chief Complaint: Breast Cancer     History of Present Illness: Leslie Potts is a 61 y.o. female who is seen today as an office consultation at the request of Thedora Hinders FNP for evaluation of Breast Cancer .   This is a 61 year old female who recently underwent routine screening mammogram.  This revealed some suspicious calcifications in the left breast.  She underwent diagnostic left mammogram that revealed pleomorphic calcifications in the upper outer quadrant spanning 6 cm extending to just behind the nipple.  There is a separate 0.2 cm group of calcifications in the upper inner quadrant.  Both of these areas were biopsied.  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    She also had MRI yesterday that confirmed a 5.2 cm region of abnormal enhancement in the mid to anterior upper outer quadrant of the left breast (5.2 x 3.4 x 2.6 cm).  This extends to the skin near the nipple.  There is a separate 1 cm tubular enhancement 3 cm medial to the biopsy clip.  She has a sister who was diagnosed at age 70 with invasive lobular carcinoma of the left breast and DCIS of the right breast.  Her sister underwent bilateral mastectomies, chemotherapy, post-mastectomy radiation, and letrozole.  It does not appear that any genetic testing was performed on her sister.  The patient reports no symptoms associated with her breasts prior to her biopsies.  The patient smokes about five cigarettes per day.   Review of Systems: A complete review of systems was obtained from the patient.  I have reviewed this information and discussed as appropriate with the patient.  See HPI as well for other ROS.  Review of Systems  Constitutional: Negative.   HENT:  Positive for tinnitus.   Eyes: Negative.   Respiratory: Negative.    Cardiovascular: Negative.   Gastrointestinal:  Positive for nausea.  Genitourinary: Negative.   Musculoskeletal: Negative.   Skin:  Negative.   Neurological: Negative.   Endo/Heme/Allergies:  Bruises/bleeds easily.  Psychiatric/Behavioral: Negative.       Medical History: Past Medical History: Diagnosis Date  Arthritis   Diabetes mellitus without complication (CMS-HCC)   History of cancer   Hypertension   Thyroid disease    Patient Active Problem List Diagnosis  Intraductal carcinoma in situ of left breast  Atherosclerotic heart disease of native coronary artery without angina pectoris  Essential hypertension  Hypercalcemia  Hypothyroidism  Hyperglycemia due to type 2 diabetes mellitus (CMS-HCC)  Tobacco use  Pure hypercholesterolemia  Type 2 diabetes mellitus without complications (CMS-HCC)   Past Surgical History: Procedure Laterality Date  knee arthoscopy    2006  shoulder arthroplasty Left   09/2020    Allergies Allergen Reactions  Meloxicam Rash   Current Outpatient Medications on File Prior to Visit Medication Sig Dispense Refill  aspirin 81 MG EC tablet 1 tablet    levothyroxine (SYNTHROID) 112 MCG tablet Take 1 tablet by mouth once daily    atorvastatin (LIPITOR) 40 MG tablet Take 1 tablet by mouth once daily    calcium carbonate 600 mg calcium (1,500 mg) Tab tablet 1 tablet with meals    carvediloL (COREG) 12.5 MG tablet Take 1 tablet by mouth 2 (two) times daily    cholecalciferol (VITAMIN D3) 2,000 unit tablet Take by mouth    fenofibrate nanocrystallized (TRICOR) 145 MG tablet Take 1 tablet by mouth once daily    lysine 1,000 mg Tab Take by mouth  metFORMIN (GLUCOPHAGE) 1000 MG tablet Take 1 tablet by mouth 2 (two) times daily    omega 3-dha-epa-fish oil (FISH OIL) 1,000 mg (120 mg-180 mg) Cap 1 capsule    OZEMPIC 1 mg/dose (4 mg/3 mL) pen injector INJECT '1MG'$  SUBCUTANEOUSLY ONCE A WEEK AFTER FINISHING 0.'5MG'$  DOSE    zinc gluconate 50 mg tablet Take 50 mg by mouth once daily    No current facility-administered medications on file prior to visit.   Family  History Problem Relation Age of Onset  Stroke Mother   High blood pressure (Hypertension) Mother   Hyperlipidemia (Elevated cholesterol) Mother   Coronary Artery Disease (Blocked arteries around heart) Mother   Diabetes Mother   Coronary Artery Disease (Blocked arteries around heart) Father   Diabetes Sister   Breast cancer Sister   Stroke Other   Coronary Artery Disease (Blocked arteries around heart) Other     Social History  Tobacco Use Smoking Status Every Day  Types: Cigarettes Smokeless Tobacco Never    Social History  Socioeconomic History  Marital status: Married Tobacco Use  Smoking status: Every Day   Types: Cigarettes  Smokeless tobacco: Never Vaping Use  Vaping Use: Unknown Substance and Sexual Activity  Alcohol use: Yes  Drug use: Never   Objective:   Vitals:  04/16/22 1613 BP: 124/70 Pulse: 82 Temp: 36.6 C (97.9 F) SpO2: 98% Weight: 64.6 kg (142 lb 6.4 oz) Height: 154.9 cm ('5\' 1"'$ )   Body mass index is 26.91 kg/m.  Physical Exam   Constitutional:  WDWN in NAD, conversant, no obvious deformities; lying in bed comfortably Eyes:  Pupils equal, round; sclera anicteric; moist conjunctiva; no lid lag HENT:  Oral mucosa moist; good dentition  Neck:  No masses palpated, trachea midline; no thyromegaly Lungs:  CTA bilaterally; normal respiratory effort Breasts:  symmetric, no nipple changes; no palpable masses or lymphadenopathy on either side CV:  Regular rate and rhythm; no murmurs; extremities well-perfused with no edema Abd:  +bowel sounds, soft, non-tender, no palpable organomegaly; no palpable hernias Musc:  Unable to assess gait; no apparent clubbing or cyanosis in extremities Lymphatic:  No palpable cervical or axillary lymphadenopathy Skin:  Warm, dry; no sign of jaundice Psychiatric - alert and oriented x 4; calm mood and affect   Labs, Imaging and Diagnostic Testing: CLINICAL DATA:  Recall from screening mammography,  calcifications involving the UPPER OUTER QUADRANT of the RIGHT breast at anterior to middle depth and a separate small group of calcifications in the slight UPPER INNER QUADRANT at middle depth   EXAM: DIGITAL DIAGNOSTIC UNILATERAL LEFT MAMMOGRAM WITH CAD   TECHNIQUE: Left digital diagnostic mammography was performed. Mammographic images were processed with CAD.   COMPARISON:  Previous exam(s).   ACR Breast Density Category b: There are scattered areas of fibroglandular density.   FINDINGS: Spot magnification CC and mediolateral views of the calcifications and a full field mediolateral view were obtained.   There are pleomorphic calcifications involving the UPPER OUTER QUADRANT at anterior to middle depth which span approximately 6 cm, extending to just behind the nipple. There are associated suspicious linear and branching forms. There is no visible associated mass.   A separate 0.2 cm group of coarse, likely dystrophic calcifications are present in the [place] at middle depth. There appears to be an associated very small isodense mass on the full field mediolateral tomosynthesis images.   IMPRESSION: 1. Suspicious calcifications involving the UPPER OUTER QUADRANT of the LEFT breast at anterior to middle depth spanning  approximately 6 cm. 2. Indeterminate 0.2 cm group of calcifications involving the slight UPPER INNER QUADRANT of the LEFT breast at middle depth, possibly associated with a very small isodense mass.   RECOMMENDATION: Stereotactic tomosynthesis core needle biopsy of the suspicious calcifications in the UPPER OUTER QUADRANT and the 0.2 cm group of calcifications in the slight UPPER INNER QUADRANT.   I have discussed the findings and recommendations with the patient. The stereotactic tomosynthesis core needle biopsy procedure was discussed with the patient and her questions were answered. She wishes to proceed with the biopsies which have been  scheduled by the Breast Imaging staff.   BI-RADS CATEGORY  5: Highly suggestive of malignancy.   Electronically Signed: By: Evangeline Dakin M.D. On: 03/15/2022 11:16  CLINICAL DATA:  61 year old female presenting for evaluation status post 2 stereotactic biopsies of the left breast for calcifications. One biopsy in the upper inner left breast (coil clip) demonstrated high-grade DCIS. Note that the biopsy marking clip is laterally displaced from the site of biopsy by about 3.1 cm. A second area of calcifications (ribbon clip) in the lateral anterior left breast also demonstrated high-grade DCIS.   EXAM: BILATERAL BREAST MRI WITH AND WITHOUT CONTRAST   TECHNIQUE: Multiplanar, multisequence MR images of both breasts were obtained prior to and following the intravenous administration of 6 ml of Gadavist   Three-dimensional MR images were rendered by post-processing of the original MR data on an independent workstation. The three-dimensional MR images were interpreted, and findings are reported in the following complete MRI report for this study. Three dimensional images were evaluated at the independent interpreting workstation using the DynaCAD thin client.   COMPARISON:  No prior MRI available for comparison. Correlation made with prior mammogram images.   FINDINGS: Breast composition: b. Scattered fibroglandular tissue.   Background parenchymal enhancement: Mild   Right breast: No mass or abnormal enhancement.   Left breast: Susceptibility artifact from the laterally displaced coil shaped biopsy marking clip is seen in the superior central aspect of the left breast (series 7, image 67). Approximately 2-3 cm medial to this biopsy marking clip, there is an elongated peripherally enhancing lesion measuring approximately 1.0 cm, which may represent the site of the stereotactic biopsy (DCIS) and/or biopsy changes (series 7, image 77). Immediately superior to the location  of this coil biopsy marking clip, there is heterogeneous clumped and branching non mass enhancement extending anteriorly and superiorly measuring 5.2 x 3.4 (sagittal measurements) x 2.6 cm (axial measurement lateral to medial). The ribbon shaped biopsy marking clip placed at the site of the anterolateral left breast in this region of enhancement is not well visualized by MRI. This enhancement extends to the base of the skin/nipple (series 13, image 62), though no definite nipple enhancement is visualized.   Lymph nodes: No abnormal appearing lymph nodes.   Ancillary findings:  None.   IMPRESSION: 1. There is a 5.2 cm region of abnormal enhancement in the mid to anterior upper outer quadrant of the left breast (complete measurement is 5.2 x 3.4 x 2.6 cm). The coil shaped biopsy marking clip, which was displaced from the site of biopsy in the medial left breast marks the posteroinferior margin of this abnormal enhancement. The abnormality extends to the base of the skin surface near the nipple, though no definite nipple or skin enhancement is identified.   2. There is a 1 cm tubular enhancement approximately 3 cm medial to the coil shaped biopsy marking clip, which likely represents biopsy changes/possible residual  DCIS from the stereotactic biopsy.   3.  No evidence of right breast malignancy.   4.  No evidence of axillary lymphadenopathy.   RECOMMENDATION: Continue treatment plan for known left breast DCIS.   BI-RADS CATEGORY  6: Known biopsy-proven malignancy.     Electronically Signed   By: Ammie Ferrier M.D.   On: 04/15/2022 11:33  Assessment and Plan: Diagnoses and all orders for this visit:  Intraductal carcinoma in situ of left breast -     Ambulatory Referral to Oncology-Medical -     Ambulatory Referral to Genetics -     Ambulatory Referral to Plastic Surgery  Family history of breast cancer in sister -     Ambulatory Referral to Plastic Surgery     I  had a very long discussion with the patient and her husband regarding her diagnosis of left breast ductal carcinoma in situ.  We discussed a possible genetic component, with her sister having just had breast cancer diagnosed a couple of years ago.  We discussed the significance of her cancer being "in situ".  We discussed the widespread nature of her DCIS.    I discussed surgical options.  The area of DCIS appears too large for a suitable lumpectomy, so mastectomy is the recommendation.  However, I would recommend genetic testing prior to scheduling surgery.  Also, she is considering reconstruction, so we will refer her to Plastics.  I did let her know that she would have to stop smoking completely to be a candidate for reconstruction.    We will refer her to Oncology urgently to consider Tamoxifen while the remainder of her work-up continues.   Genetics referral    Omari Mcmanaway Jearld Adjutant, MD  04/16/2022 9:27 PM

## 2022-04-17 ENCOUNTER — Telehealth: Payer: Self-pay | Admitting: Hematology and Oncology

## 2022-04-17 NOTE — Telephone Encounter (Signed)
Scheduled appt per 8/16 referrals. Pt is aware of appt date and time. Pt is aware to arrive 15 mins prior to appt time and to bring and updated insurance card. Pt is aware of appt location.

## 2022-04-24 ENCOUNTER — Other Ambulatory Visit: Payer: Self-pay | Admitting: Genetic Counselor

## 2022-04-24 ENCOUNTER — Inpatient Hospital Stay: Payer: 59 | Attending: Hematology and Oncology | Admitting: Hematology and Oncology

## 2022-04-24 ENCOUNTER — Inpatient Hospital Stay (HOSPITAL_BASED_OUTPATIENT_CLINIC_OR_DEPARTMENT_OTHER): Payer: 59 | Admitting: Genetic Counselor

## 2022-04-24 ENCOUNTER — Encounter: Payer: Self-pay | Admitting: Genetic Counselor

## 2022-04-24 ENCOUNTER — Other Ambulatory Visit: Payer: Self-pay

## 2022-04-24 ENCOUNTER — Inpatient Hospital Stay: Payer: 59

## 2022-04-24 DIAGNOSIS — F17210 Nicotine dependence, cigarettes, uncomplicated: Secondary | ICD-10-CM | POA: Insufficient documentation

## 2022-04-24 DIAGNOSIS — Z803 Family history of malignant neoplasm of breast: Secondary | ICD-10-CM | POA: Diagnosis not present

## 2022-04-24 DIAGNOSIS — D0512 Intraductal carcinoma in situ of left breast: Secondary | ICD-10-CM | POA: Diagnosis present

## 2022-04-24 DIAGNOSIS — I10 Essential (primary) hypertension: Secondary | ICD-10-CM | POA: Insufficient documentation

## 2022-04-24 DIAGNOSIS — E119 Type 2 diabetes mellitus without complications: Secondary | ICD-10-CM | POA: Diagnosis not present

## 2022-04-24 HISTORY — DX: Family history of malignant neoplasm of breast: Z80.3

## 2022-04-24 LAB — GENETIC SCREENING ORDER

## 2022-04-24 MED ORDER — TAMOXIFEN CITRATE 10 MG PO TABS: 10.0000 mg | ORAL_TABLET | Freq: Every day | ORAL | 1 refills | Status: DC

## 2022-04-24 NOTE — Progress Notes (Signed)
EFERRING PROVIDER: Donnie Mesa, MD [address],  [place] [address]  PRIMARY PROVIDER:  Holland Commons, FNP  PRIMARY REASON FOR VISIT:  Encounter Diagnoses  Name Primary?   Ductal carcinoma in situ (DCIS) of left breast Yes   Family history of breast cancer     HISTORY OF PRESENT ILLNESS:   Ms. Heese, a 61 y.o. female, was seen for a [facility] cancer genetics consultation at the request of Dr. Georgette Dover due to a personal and family history of breast cancer.  Ms. Mattos presents to clinic today to discuss the possibility of a hereditary predisposition to cancer, to discuss genetic testing, and to further clarify her future cancer risks, as well as potential cancer risks for family members.   In July 2023, at the age of 48, Ms. Reisen was diagnosed with ductal carcinoma in situ of the left breast. The preliminary treatment plan is pending.   CANCER HISTORY:  Oncology History   No history exists.    RISK FACTORS:  Colonoscopy: yes;  most recent before age 49 ; recommended f/u 10 year interval Hysterectomy: no. Ovaries intact: yes.  Any excessive radiation exposure before age 23: no Dermatology screening: no  Past Medical History:  Diagnosis Date   Arthritis    Diabetes mellitus without complication (HCC)    Family history of adverse reaction to anesthesia    her mother hard time waking up    Hyperlipidemia    Hypertension    Hypothyroidism     Past Surgical History:  Procedure Laterality Date   KNEE ARTHROSCOPY Left    ORIF HUMERUS FRACTURE Left 09/28/2020   Procedure: OPEN REDUCTION INTERNAL FIXATION (ORIF) PROXIMAL HUMERUS FRACTURE;  Surgeon: Verner Mould, MD;  Location: [facility];  Service: Orthopedics;  Laterality: Left;  NEEDS RNFA     FAMILY HISTORY:  We obtained a detailed, 4-generation family history.  Significant diagnoses are listed below: Family History  Problem Relation Age of Onset   Breast cancer Sister 45        bilateral   Colon polyps Maternal Grandmother        unknown #   Colon polyps Paternal Grandfather        unknown #   Cancer Other        MGM's 4 brothers and 1 sister; unknown type     Ms. Oakey reports that her niece may have had negative genetic testing previously.  No report was available for review today. Ms. Kittler is unaware of other previous family history of genetic testing for hereditary cancer risks. There is no reported Ashkenazi Jewish ancestry. There is no known consanguinity.  GENETIC COUNSELING ASSESSMENT: Ms. Rosenau is a 61 y.o. female with a personal and family history of cancer which is somewhat suggestive of a hereditary cancer syndrome and predisposition to cancer given the presence of related cancers in the family. We, therefore, discussed and recommended the following at today's visit.   DISCUSSION: We discussed that 5 - 10% of cancer is hereditary, with most cases of hereditary breast cancer associated with mutations in BRCA1/2.  There are other genes that can be associated with hereditary breast cancer syndromes.  Type of cancer risk and level of risk are gene-specific. We discussed that testing is beneficial for several reasons including knowing how to follow individuals after completing their treatment, identifying whether potential treatment options would be beneficial, and understanding if other family members could be at risk for cancer and allowing them  to undergo genetic testing.   We reviewed the characteristics, features and inheritance patterns of hereditary cancer syndromes. We also discussed genetic testing, including the appropriate family members to test, the process of testing, insurance coverage and turn-around-time for results. We discussed the implications of a negative, positive and/or variant of uncertain significant result. In order to get genetic test results in a timely manner so that Ms. Rhee can use these genetic test results for surgical  decisions, we recommended Ms. Bragdon pursue genetic testing for the [facility] BRCAPlus Panel. Once complete, we recommend Ms. Perrow pursue reflex genetic testing to a more comprehensive gene panel.   Ms. Labarbera  was offered a common hereditary cancer panel (47 genes) and an expanded pan-cancer panel (77 genes). Ms. Park was informed of the benefits and limitations of each panel, including that expanded pan-cancer panels contain genes that do not have clear management guidelines at this point in time.  We also discussed that as the number of genes included on a panel increases, the chances of variants of uncertain significance increases.  After considering the benefits and limitations of each gene panel, Ms. Bronk  elected to have the common hereditary cancers through [facility].  The CustomNext-Cancer+RNAinsight panel offered by [facility] includes sequencing and rearrangement analysis for the following 47 genes:  APC, ATM, AXIN2, BARD1, BMPR1A, BRCA1, BRCA2, BRIP1, CDH1, CDK4, CDKN2A, CHEK2, DICER1, EPCAM, GREM1, HOXB13, MEN1, MLH1, MSH2, MSH3, MSH6, MUTYH, NBN, NF1, NF2, NTHL1, PALB2, PMS2, POLD1, POLE, PTEN, RAD51C, RAD51D, RECQL, RET, SDHA, SDHAF2, SDHB, SDHC, SDHD, SMAD4, SMARCA4, STK11, TP53, TSC1, TSC2, and VHL.  RNA data is routinely analyzed for use in variant interpretation for all genes.  Based on Ms. Kimple's personal and family history of breast cancer (3 or more breast cancer diagnosis in family), she meets medical criteria for genetic testing. Despite that she meets criteria, she may still have an out of pocket cost. We discussed that if her out of pocket cost for testing is over $100, the laboratory should contact them to discuss self-pay prices, patient pay assistance programs, if applicable, and other billing options.   PLAN: After considering the risks, benefits, and limitations, Ms. Wauters provided informed consent to pursue genetic testing and the blood sample was  sent to [facility] for analysis of the BRCAPlus Panel and CustomNext-Cancer +RNAinsight Panel. Results should be available within approximately 1-2 weeks' time, at which point they will be disclosed by telephone to Ms. Ruderman, as will any additional recommendations warranted by these results. Ms. Bollen will receive a summary of her genetic counseling visit and a copy of her results once available. This information will also be available in Epic.   Lastly, we encouraged Ms. Bendix to remain in contact with cancer genetics annually so that we can continuously update the family history and inform her of any changes in cancer genetics and testing that may be of benefit for this family.   Ms. Peace questions were answered to her satisfaction today. Our contact information was provided should additional questions or concerns arise. Thank you for the referral and allowing [place] to share in the care of your patient.   Katerin Negrete M. Joette Catching, [place], [facility] Genetic Counselor [email or web address] (P) [phone]  The patient was seen for a total of 40 minutes in face-to-face genetic counseling.  Patient was accompanied by her husband, Philippa Chester.  Drs. Lindi Adie and/or Burr Medico were available to discuss this case as needed.  _______________________________________________________________________ For Office Staff:  Number of people involved in session: 2 Was  an Intern/ student involved with case: no

## 2022-04-24 NOTE — Assessment & Plan Note (Addendum)
03/29/2022: Screening mammogram detected left breast calcifications, breast MRI revealed 5.2 cm abnormal enhancement in the left breast extending to the skin below the nipple, biopsy medial and lateral aspect: Revealed high-grade DCIS with necrosis ER 10%, PR 0%  Pathology review: I discussed with the patient the difference between DCIS and invasive breast cancer. It is considered a precancerous lesion. DCIS is classified as a 0. It is generally detected through mammograms as calcifications. We discussed the significance of grades and its impact on prognosis. We also discussed the importance of ER and PR receptors and their implications to adjuvant treatment options. Prognosis of DCIS dependence on grade, comedo necrosis. It is anticipated that if not treated, 20-30% of DCIS can develop into invasive breast cancer.  Recommendation: 1.  mastectomy 2. antiestrogen therapy with tamoxifen 5 years  Tamoxifen counseling: We discussed the risks and benefits of tamoxifen. These include but not limited to insomnia, hot flashes, mood changes, vaginal dryness, and weight gain. Although rare, serious side effects including endometrial cancer, risk of blood clots were also discussed. We strongly believe that the benefits far outweigh the risks. Patient understands these risks and consented to starting treatment. Planned treatment duration is 5 years.  She had done genetic testing and is awaiting for the results before she can be operated on.  Therefore we will start her on 10 mg of tamoxifen daily.  Return to clinic after surgery to discuss the final pathology report and come up with an adjuvant treatment plan.

## 2022-04-24 NOTE — Progress Notes (Signed)
[place] CONSULT NOTE  Patient Care Team: Holland Commons, FNP as PCP - General (Internal Medicine)  CHIEF COMPLAINTS/PURPOSE OF CONSULTATION:  Newly diagnosed left breast DCIS  HISTORY OF PRESENTING ILLNESS:  Leslie Potts 61 y.o. female is here because of recent diagnosis of left breast DCIS.  Patient had routine screening mammogram that detected abnormality in the left breast and subsequently she underwent ultrasound and biopsy which came back as high-grade DCIS that was ER 10% PR 0%.  She underwent a breast MRI that revealed 5.2 cm area of abnormal enhancement extending to the skin below the nipple.  She saw Dr. Georgette Dover who recommended mastectomy and reconstruction.  She underwent genetic testing and is awaiting the results of those test before she can undergo surgery.  She is here today accompanied by her husband to discuss her treatment plan.  I reviewed her records extensively and collaborated the history with the patient.  SUMMARY OF ONCOLOGIC HISTORY: Oncology History  Ductal carcinoma in situ (DCIS) of left breast  03/21/2022 Initial Diagnosis   Screening mammogram detected left breast calcifications, breast MRI revealed 5.2 cm abnormal enhancement in the left breast extending to the skin below the nipple, biopsy medial and lateral aspect: Revealed high-grade DCIS with necrosis ER 10%, PR 0%   04/24/2022 Cancer Staging   Staging form: Breast, AJCC 8th Edition - Clinical: Stage 0 (cTis (DCIS), cN0, cM0, ER+, PR-, HER2: Not Assessed) - Signed by Nicholas Lose, MD on 04/24/2022 Nuclear grade: G3      MEDICAL HISTORY:  Past Medical History:  Diagnosis Date   Arthritis    Diabetes mellitus without complication ([place])    Family history of adverse reaction to anesthesia    her mother hard time waking up    Family history of breast cancer 04/24/2022   Hyperlipidemia    Hypertension    Hypothyroidism     SURGICAL HISTORY: Past Surgical History:   Procedure Laterality Date   KNEE ARTHROSCOPY Left    ORIF HUMERUS FRACTURE Left 09/28/2020   Procedure: OPEN REDUCTION INTERNAL FIXATION (ORIF) PROXIMAL HUMERUS FRACTURE;  Surgeon: Verner Mould, MD;  Location: [facility];  Service: Orthopedics;  Laterality: Left;  NEEDS RNFA    SOCIAL HISTORY: Social History   Socioeconomic History   Marital status: Married    Spouse name: Not on file   Number of children: Not on file   Years of education: Not on file   Highest education level: Not on file  Occupational History   Not on file  Tobacco Use   Smoking status: Every Day    Packs/day: 0.25    Years: 30.00    Total pack years: 7.50    Types: Cigarettes   Smokeless tobacco: Never   Tobacco comments:    states she doesn't smoke much  Substance and Sexual Activity   Alcohol use: Yes    Alcohol/week: 3.0 - 4.0 standard drinks of alcohol    Types: 3 - 4 Cans of beer per week    Comment: 2 beverages per night   Drug use: Never   Sexual activity: Not on file  Other Topics Concern   Not on file  Social History Narrative   Not on file   Social Determinants of Health   Financial Resource Strain: Not on file  Food Insecurity: Not on file  Transportation Needs: Not on file  Physical Activity: Not on file  Stress: Not on file  Social Connections: Not on file  Intimate  Partner Violence: Not on file    FAMILY HISTORY: Family History  Problem Relation Age of Onset   Heart attack Mother    Breast cancer Sister 47       bilateral   Colon polyps Maternal Grandmother        unknown #   Colon polyps Paternal Grandfather        unknown #   Cancer Other        MGM's 4 brothers and 1 sister; unknown type    ALLERGIES:  is allergic to meloxicam.  MEDICATIONS:  Current Outpatient Medications  Medication Sig Dispense Refill   tamoxifen (NOLVADEX) 10 MG tablet Take 1 tablet (10 mg total) by mouth daily. 30 tablet 1   aspirin EC 81 MG tablet Take 1 tablet (81 mg total) by mouth  daily. Swallow whole. 90 tablet 3   atorvastatin (LIPITOR) 40 MG tablet Take 40 mg by mouth every evening.     calcium carbonate (OSCAL) 1500 (600 Ca) MG TABS tablet Take 600 mg by mouth 2 (two) times daily.     carvedilol (COREG) 12.5 MG tablet Take 12.5 mg by mouth in the morning and at bedtime.     Cholecalciferol (VITAMIN D3) 50 MCG (2000 UT) TABS Take 4,000 Units by mouth every evening.     fenofibrate (TRICOR) 145 MG tablet Take 145 mg by mouth daily.     levothyroxine (SYNTHROID) 112 MCG tablet Take 112 mcg by mouth daily.     Lysine HCl 1000 MG TABS Take 1,000 mg by mouth daily.     metFORMIN (GLUCOPHAGE) 1000 MG tablet Take 1,000 mg by mouth 2 (two) times daily.     methocarbamol (ROBAXIN) 500 MG tablet Take 1 tablet (500 mg total) by mouth 4 (four) times daily as needed for muscle spasms. (Patient not taking: Reported on 05/01/2021) 30 tablet 0   Multiple Vitamin (MULTIVITAMIN WITH MINERALS) TABS tablet Take 1 tablet by mouth every evening.     Naphazoline-Pheniramine (OPCON-A) 0.027-0.315 % SOLN Place 1 drop into both eyes daily. Allergy relief     Omega-3 Fatty Acids (FISH OIL PO) Take 1 capsule by mouth daily.     pioglitazone (ACTOS) 30 MG tablet Take 30 mg by mouth daily. (Patient not taking: Reported on 05/01/2021)     Semaglutide,0.25 or 0.5MG/DOS, (OZEMPIC, 0.25 OR 0.5 MG/DOSE,) 2 MG/1.5ML SOPN Inject 0.5 mg into the skin every Wednesday.     Zinc 50 MG TABS Take 50 mg by mouth daily.     No current facility-administered medications for this visit.    REVIEW OF SYSTEMS:   Constitutional: Denies fevers, chills or abnormal night sweats Breast:  Denies any palpable lumps or discharge All other systems were reviewed with the patient and are negative.  PHYSICAL EXAMINATION: ECOG PERFORMANCE STATUS: 1 - Symptomatic but completely ambulatory  Vitals:   04/24/22 1304  BP: (!) 143/76  Pulse: 87  Resp: 18  Temp: (!) 97.2 F (36.2 C)  SpO2: 98%   Filed Weights   04/24/22  1304  Weight: 141 lb 1.6 oz (64 kg)    GENERAL:alert, no distress and comfortable    LABORATORY DATA:  I have reviewed the data as listed Lab Results  Component Value Date   WBC 8.6 09/27/2020   HGB 11.1 (L) 09/27/2020   HCT 32.1 (L) 09/27/2020   MCV 95.0 09/27/2020   PLT 358 09/27/2020   Lab Results  Component Value Date   NA 138 09/27/2020   K 4.0  09/27/2020   CL 104 09/27/2020   CO2 23 09/27/2020    RADIOGRAPHIC STUDIES: I have personally reviewed the radiological reports and agreed with the findings in the report.  ASSESSMENT AND PLAN:  Ductal carcinoma in situ (DCIS) of left breast 03/29/2022: Screening mammogram detected left breast calcifications, breast MRI revealed 5.2 cm abnormal enhancement in the left breast extending to the skin below the nipple, biopsy medial and lateral aspect: Revealed high-grade DCIS with necrosis ER 10%, PR 0%  Pathology review: I discussed with the patient the difference between DCIS and invasive breast cancer. It is considered a precancerous lesion. DCIS is classified as a 0. It is generally detected through mammograms as calcifications. We discussed the significance of grades and its impact on prognosis. We also discussed the importance of ER and PR receptors and their implications to adjuvant treatment options. Prognosis of DCIS dependence on grade, comedo necrosis. It is anticipated that if not treated, 20-30% of DCIS can develop into invasive breast cancer.  Recommendation: 1.  mastectomy 2. antiestrogen therapy with tamoxifen 5 years  Tamoxifen counseling: We discussed the risks and benefits of tamoxifen. These include but not limited to insomnia, hot flashes, mood changes, vaginal dryness, and weight gain. Although rare, serious side effects including endometrial cancer, risk of blood clots were also discussed. We strongly believe that the benefits far outweigh the risks. Patient understands these risks and consented to starting  treatment. Planned treatment duration is 5 years.  She had done genetic testing and is awaiting for the results before she can be operated on.  Therefore we will start her on 10 mg of tamoxifen daily.  Return to clinic after surgery to discuss the final pathology report and come up with an adjuvant treatment plan.    All questions were answered. The patient knows to call the clinic with any problems, questions or concerns.    Harriette Ohara, MD 04/24/22

## 2022-04-25 ENCOUNTER — Encounter: Payer: Self-pay | Admitting: *Deleted

## 2022-04-29 ENCOUNTER — Telehealth: Payer: Self-pay | Admitting: Licensed Clinical Social Worker

## 2022-04-29 NOTE — Telephone Encounter (Signed)
[facility] Clinical Social Work  Clinical Social Work was referred by new patient protocol for assessment of psychosocial needs.  Clinical Social Worker attempted to contact patient by phone  to offer support and assess for needs.   No answer. Left VM with direct contact information.      Octavion Mollenkopf E Llewellyn Schoenberger, LCSW  Clinical Social Worker [facility]        

## 2022-05-02 ENCOUNTER — Encounter: Payer: Self-pay | Admitting: Genetic Counselor

## 2022-05-02 ENCOUNTER — Telehealth: Payer: Self-pay | Admitting: Genetic Counselor

## 2022-05-02 DIAGNOSIS — Z1379 Encounter for other screening for genetic and chromosomal anomalies: Secondary | ICD-10-CM | POA: Insufficient documentation

## 2022-05-02 NOTE — Telephone Encounter (Signed)
Revealed negative BRCAPlus Panel.  Results of pan-cancer panel pending.

## 2022-05-03 DIAGNOSIS — C801 Malignant (primary) neoplasm, unspecified: Secondary | ICD-10-CM

## 2022-05-03 HISTORY — DX: Malignant (primary) neoplasm, unspecified: C80.1

## 2022-05-07 ENCOUNTER — Ambulatory Visit: Payer: Self-pay | Admitting: Surgery

## 2022-05-09 ENCOUNTER — Encounter: Payer: Self-pay | Admitting: *Deleted

## 2022-05-13 ENCOUNTER — Encounter (HOSPITAL_BASED_OUTPATIENT_CLINIC_OR_DEPARTMENT_OTHER): Payer: Self-pay | Admitting: Surgery

## 2022-05-13 NOTE — H&P (Signed)
Subjective:     Patient ID: Leslie Potts is a 61 y.o. female.   HPI   Returns for follow up discussion breast reconstruction. Presented following screening MMG with left breast calcifications. Diagnostic MMG showed calcifications left UO spanning 6 cm. A 0.2 cm group of calcifications involving the left UIQ noted, possibly associated with a small mass. Biopsies labeled left breast medial with high grade DCIS ER+/PR-, left breast lateral microscopic focus high grade DCIS.   MRI demonstrated a 5.2 cm region of abnormal enhancement in the left UOQ with biopsy clip. The abnormality extends to the base of the skin surface near the nipple, though no definite nipple or skin enhancement identified.  There is a separate 1 cm tubular enhancement 3 cm medial to the biopsy clip.   Given span of disease mastectomy recommended. Tamoxifen started while surgical planning and genetics completed. Plan 5 years.   BRCAPlus panel negative, full panel testing pending. Sister with breast ca. She underwent bilateral mastectomies without reconstruction.   Current 62 D desires C. Wt stable over last year.    Since last visit reports quit smoking 8.20.23   PMH included DM, HbA1c 7.4 per patient.On Ozempic.   Lives with spouse. Works Environmental health practitioner for [employer]. Also spends week at a time caring for 75 yo mother alternates with sister.   Review of Systems  Musculoskeletal: Positive for arthralgias.  Hematological: Bruises/bleeds easily.    Remainder 12 point review negative    Objective:   Physical Exam Cardiovascular:     Rate and Rhythm: Normal rate and regular rhythm.     Heart sounds: Normal heart sounds.  Pulmonary:     Effort: Pulmonary effort is normal.     Breath sounds: Normal breath sounds.  Lymphadenopathy:     Upper Body:     Right upper body: No axillary adenopathy.     Left upper body: No axillary adenopathy.  Skin:    Comments: Fitzpatrick 2     left shoulder fine line faded scar Breasts: no palpable masses grade 3 ptosis bilateral SN to nipple R 28 L 28 cm BW R 21 L 21 cm Nipple to IMF R 12 L 12 cm      Assessment:    DCIS left breast    Plan:     Plan left mastectomy with tissue expander acellular dermis reconstruction.   Encouraged her to remain nicotine free.    Hold tamoxifen 7-10 d prior to surgery. Hold Ozempic week prior to surgery. Hold ASA 7-10 d prior to surgery.   Reviewed options prosthesis and provided brochure for [facility] [place]. Discussed immediate vs delayed, autologous vs implant based reconstruction. Reviewed incisions, drains, OR length, hospital stay and recovery for each. Discussed process of expansion and implant based risks including rupture, MRI surveillance for silicone implants, infection requiring surgery or removal, contracture. Discussed asymmetry one can expect with implant unilateral and natural breast on opposite. Discussed TRAM vs DIEP, latter would require treatment at tertiary center. Discussed abdominal based complications including failure flap, abdominal bulge or hernia. Discussed future surgery dependent on adjuvant treatments.   Reviewed reconstrcuction will be asensate and not stimulate. Reviewed with both risks mastectomy flap necrosis requiring additional surgery. Given imaging and breast ptosis recommend SRM.    Discussed use of acellular dermis in reconstruction, cadaveric source, incorporation over several weeks, risk that if has seroma or infection can act as additional nidus for infection if not incorporated. Reviewed this is off label  use of ADM.   Discussed prepectoral vs sub pectoral reconstruction. Discussed with patient and benefit of this is no animation deformity, may be less pain. Risk may be more visible rippling over upper poles, greater need of ADM. Reviewed pre pectoral would require larger amount acellular dermis, more drains. Discussed any type reconstruction also  risks long term displacement implant and visible rippling. If prepectoral counseled I would recommend she be comfortable with silicone implants as more options that have less rippling. She agrees to prepectoral placement.   Reviewed additional risks including but not limited to risks mastectomy flap necrosis requiring additional surgery, seroma, hematoma, asymmetry, need to additional procedures, fat necrosis, DVT/PE, damage to adjacent structures, cardiopulmonary complications   Drain teaching completed. Rx for [facility] [place] given. Rx for tramadol robaxin and doxycycline given.

## 2022-05-15 ENCOUNTER — Encounter (HOSPITAL_BASED_OUTPATIENT_CLINIC_OR_DEPARTMENT_OTHER)
Admission: RE | Admit: 2022-05-15 | Discharge: 2022-05-15 | Disposition: A | Discharge status: Home / Self Care | Payer: 59 | Source: Ambulatory Visit | Attending: Surgery | Admitting: Surgery

## 2022-05-15 DIAGNOSIS — Z01818 Encounter for other preprocedural examination: Secondary | ICD-10-CM | POA: Insufficient documentation

## 2022-05-15 LAB — BASIC METABOLIC PANEL
Anion gap: 7 (ref 5–15)
BUN: 11 mg/dL (ref 6–20)
CO2: 22 mmol/L (ref 22–32)
Calcium: 9.6 mg/dL (ref 8.9–10.3)
Chloride: 107 mmol/L (ref 98–111)
Creatinine, Ser: 0.56 mg/dL (ref 0.44–1.00)
GFR, Estimated: >60 mL/min (ref >60)
Glucose, Bld: 149 mg/dL — ABNORMAL HIGH (ref 70–99)
Potassium: 4.5 mmol/L (ref 3.5–5.1)
Sodium: 136 mmol/L (ref 135–145)

## 2022-05-15 NOTE — Progress Notes (Signed)
Gave patient CHG soap with instructions, patient verbalized understanding.       Patient Instructions  The night before surgery:  No food after midnight. ONLY clear liquids after midnight  The day of surgery (if you do NOT have diabetes):  Drink ONE (1) Pre-Surgery Clear Ensure as directed.   This drink was given to you during your hospital  pre-op appointment visit. The pre-op nurse will instruct you on the time to drink the  Pre-Surgery Ensure depending on your surgery time. Finish the drink at the designated time by the pre-op nurse.  Nothing else to drink after completing the  Pre-Surgery Clear Ensure.  The day of surgery (if you have diabetes): Drink ONE (1) Gatorade 2 (G2) as directed. This drink was given to you during your hospital  pre-op appointment visit.  The pre-op nurse will instruct you on the time to drink the   Gatorade 2 (G2) depending on your surgery time. Color of the Gatorade may vary. Red is not allowed. Nothing else to drink after completing the  Gatorade 2 (G2).         If you have questions, please contact your surgeon's office. 

## 2022-05-24 ENCOUNTER — Other Ambulatory Visit: Payer: Self-pay

## 2022-05-24 ENCOUNTER — Ambulatory Visit (HOSPITAL_BASED_OUTPATIENT_CLINIC_OR_DEPARTMENT_OTHER)
Admission: RE | Admit: 2022-05-24 | Discharge: 2022-05-25 | Disposition: A | Discharge status: Home / Self Care | Payer: 59 | Attending: Plastic Surgery | Admitting: Plastic Surgery

## 2022-05-24 ENCOUNTER — Ambulatory Visit (HOSPITAL_BASED_OUTPATIENT_CLINIC_OR_DEPARTMENT_OTHER): Payer: 59 | Admitting: Anesthesiology

## 2022-05-24 ENCOUNTER — Encounter (HOSPITAL_BASED_OUTPATIENT_CLINIC_OR_DEPARTMENT_OTHER)
Admission: RE | Disposition: A | Discharge status: Home / Self Care | Payer: Self-pay | Source: Home / Self Care | Attending: Plastic Surgery

## 2022-05-24 ENCOUNTER — Encounter (HOSPITAL_BASED_OUTPATIENT_CLINIC_OR_DEPARTMENT_OTHER): Payer: Self-pay | Admitting: Surgery

## 2022-05-24 DIAGNOSIS — I10 Essential (primary) hypertension: Secondary | ICD-10-CM | POA: Insufficient documentation

## 2022-05-24 DIAGNOSIS — Z9641 Presence of insulin pump (external) (internal): Secondary | ICD-10-CM | POA: Diagnosis not present

## 2022-05-24 DIAGNOSIS — E119 Type 2 diabetes mellitus without complications: Secondary | ICD-10-CM | POA: Insufficient documentation

## 2022-05-24 DIAGNOSIS — D0512 Intraductal carcinoma in situ of left breast: Secondary | ICD-10-CM | POA: Diagnosis present

## 2022-05-24 DIAGNOSIS — M2550 Pain in unspecified joint: Secondary | ICD-10-CM | POA: Insufficient documentation

## 2022-05-24 DIAGNOSIS — Z170 Estrogen receptor positive status [ER+]: Secondary | ICD-10-CM | POA: Diagnosis not present

## 2022-05-24 DIAGNOSIS — Z87891 Personal history of nicotine dependence: Secondary | ICD-10-CM | POA: Diagnosis not present

## 2022-05-24 DIAGNOSIS — M1990 Unspecified osteoarthritis, unspecified site: Secondary | ICD-10-CM | POA: Insufficient documentation

## 2022-05-24 HISTORY — PX: TOTAL MASTECTOMY: SHX6129

## 2022-05-24 HISTORY — PX: BREAST RECONSTRUCTION WITH PLACEMENT OF TISSUE EXPANDER AND ALLODERM: SHX6805

## 2022-05-24 LAB — GLUCOSE, CAPILLARY
Glucose-Capillary: 146 mg/dL — ABNORMAL HIGH (ref 70–99)
Glucose-Capillary: 145 mg/dL — ABNORMAL HIGH (ref 70–99)
Glucose-Capillary: 218 mg/dL — ABNORMAL HIGH (ref 70–99)

## 2022-05-24 SURGERY — MASTECTOMY, SIMPLE
Anesthesia: Regional | Site: Breast | Laterality: Left

## 2022-05-24 MED ORDER — FENTANYL CITRATE (PF) 100 MCG/2ML IJ SOLN: 25.0000 ug | INTRAMUSCULAR | Status: DC | PRN

## 2022-05-24 MED ORDER — FENTANYL CITRATE (PF) 100 MCG/2ML IJ SOLN
100.0000 ug | Freq: Once | INTRAMUSCULAR | Status: AC
  Administered 2022-05-24: 50 ug via INTRAVENOUS

## 2022-05-24 MED ORDER — ONDANSETRON HCL 4 MG/2ML IJ SOLN
INTRAMUSCULAR | Status: AC
  Filled 2022-05-24: qty 2

## 2022-05-24 MED ORDER — LIDOCAINE HCL (CARDIAC) PF 100 MG/5ML IV SOSY
PREFILLED_SYRINGE | INTRAVENOUS | Status: DC | PRN
  Administered 2022-05-24: 60 mg via INTRAVENOUS

## 2022-05-24 MED ORDER — EPHEDRINE SULFATE (PRESSORS) 50 MG/ML IJ SOLN
INTRAMUSCULAR | Status: DC | PRN
  Administered 2022-05-24: 10 mg via INTRAVENOUS

## 2022-05-24 MED ORDER — PROPOFOL 10 MG/ML IV BOLUS
INTRAVENOUS | Status: AC
  Filled 2022-05-24: qty 20

## 2022-05-24 MED ORDER — POVIDONE-IODINE 10 % EX SOLN
CUTANEOUS | Status: DC | PRN
  Administered 2022-05-24: 1 via TOPICAL

## 2022-05-24 MED ORDER — LEVOTHYROXINE SODIUM 112 MCG PO TABS
112.0000 ug | ORAL_TABLET | Freq: Every day | ORAL | Status: DC
  Filled 2022-05-24: qty 1

## 2022-05-24 MED ORDER — SODIUM CHLORIDE 0.9 % IV SOLN
INTRAVENOUS | Status: AC
  Filled 2022-05-24: qty 10

## 2022-05-24 MED ORDER — LIDOCAINE 2% (20 MG/ML) 5 ML SYRINGE
INTRAMUSCULAR | Status: AC
  Filled 2022-05-24: qty 5

## 2022-05-24 MED ORDER — DEXAMETHASONE SODIUM PHOSPHATE 10 MG/ML IJ SOLN
INTRAMUSCULAR | Status: AC
  Filled 2022-05-24: qty 1

## 2022-05-24 MED ORDER — BUPIVACAINE-EPINEPHRINE (PF) 0.25% -1:200000 IJ SOLN
INTRAMUSCULAR | Status: AC
  Filled 2022-05-24: qty 30

## 2022-05-24 MED ORDER — MAGTRACE LYMPHATIC TRACER
INTRAMUSCULAR | Status: DC | PRN
  Administered 2022-05-24: 2 mL via INTRAMUSCULAR

## 2022-05-24 MED ORDER — MIDAZOLAM HCL 2 MG/2ML IJ SOLN
INTRAMUSCULAR | Status: AC
  Filled 2022-05-24: qty 2

## 2022-05-24 MED ORDER — ONDANSETRON 4 MG PO TBDP: 4.0000 mg | ORAL_TABLET | Freq: Four times a day (QID) | ORAL | Status: DC | PRN

## 2022-05-24 MED ORDER — OXYCODONE HCL 5 MG PO TABS: 5.0000 mg | ORAL_TABLET | Freq: Once | ORAL | Status: DC | PRN

## 2022-05-24 MED ORDER — CARVEDILOL 12.5 MG PO TABS
12.5000 mg | ORAL_TABLET | Freq: Two times a day (BID) | ORAL | Status: DC
  Administered 2022-05-24: 12.5 mg via ORAL
  Filled 2022-05-24 (×2): qty 1

## 2022-05-24 MED ORDER — HYDROMORPHONE HCL 1 MG/ML IJ SOLN: 0.5000 mg | INTRAMUSCULAR | Status: DC | PRN

## 2022-05-24 MED ORDER — FENTANYL CITRATE (PF) 100 MCG/2ML IJ SOLN
INTRAMUSCULAR | Status: AC
  Filled 2022-05-24: qty 2

## 2022-05-24 MED ORDER — CHLORHEXIDINE GLUCONATE CLOTH 2 % EX PADS: 6.0000 | MEDICATED_PAD | Freq: Once | CUTANEOUS | Status: DC

## 2022-05-24 MED ORDER — MIDAZOLAM HCL 2 MG/2ML IJ SOLN
2.0000 mg | Freq: Once | INTRAMUSCULAR | Status: AC
  Administered 2022-05-24: 1 mg via INTRAVENOUS

## 2022-05-24 MED ORDER — OXYCODONE HCL 5 MG/5ML PO SOLN: 5.0000 mg | Freq: Once | ORAL | Status: DC | PRN

## 2022-05-24 MED ORDER — CEFAZOLIN SODIUM-DEXTROSE 2-4 GM/100ML-% IV SOLN
INTRAVENOUS | Status: AC
  Filled 2022-05-24: qty 100

## 2022-05-24 MED ORDER — PROPOFOL 500 MG/50ML IV EMUL
INTRAVENOUS | Status: DC | PRN
  Administered 2022-05-24: 25 ug/kg/min via INTRAVENOUS

## 2022-05-24 MED ORDER — CEFAZOLIN SODIUM-DEXTROSE 1-4 GM/50ML-% IV SOLN
1.0000 g | Freq: Three times a day (TID) | INTRAVENOUS | Status: DC
  Administered 2022-05-24 – 2022-05-25 (×2): 1 g via INTRAVENOUS
  Filled 2022-05-24 (×3): qty 50

## 2022-05-24 MED ORDER — ACETAMINOPHEN 500 MG PO TABS
1000.0000 mg | ORAL_TABLET | ORAL | Status: AC
  Administered 2022-05-24: 1000 mg via ORAL

## 2022-05-24 MED ORDER — CEFAZOLIN SODIUM-DEXTROSE 2-4 GM/100ML-% IV SOLN
2.0000 g | INTRAVENOUS | Status: AC
  Administered 2022-05-24: 2 g via INTRAVENOUS

## 2022-05-24 MED ORDER — ONDANSETRON HCL 4 MG/2ML IJ SOLN
INTRAMUSCULAR | Status: DC | PRN
  Administered 2022-05-24: 4 mg via INTRAVENOUS

## 2022-05-24 MED ORDER — FENTANYL CITRATE (PF) 100 MCG/2ML IJ SOLN
INTRAMUSCULAR | Status: DC | PRN
  Administered 2022-05-24: 25 ug via INTRAVENOUS
  Administered 2022-05-24: 50 ug via INTRAVENOUS
  Administered 2022-05-24: 25 ug via INTRAVENOUS

## 2022-05-24 MED ORDER — PROPOFOL 10 MG/ML IV BOLUS
INTRAVENOUS | Status: DC | PRN
  Administered 2022-05-24: 150 mg via INTRAVENOUS

## 2022-05-24 MED ORDER — ONDANSETRON HCL 4 MG/2ML IJ SOLN: 4.0000 mg | Freq: Four times a day (QID) | INTRAMUSCULAR | Status: DC | PRN

## 2022-05-24 MED ORDER — AMISULPRIDE (ANTIEMETIC) 5 MG/2ML IV SOLN: 10.0000 mg | Freq: Once | INTRAVENOUS | Status: DC | PRN

## 2022-05-24 MED ORDER — LACTATED RINGERS IV SOLN: INTRAVENOUS | Status: DC

## 2022-05-24 MED ORDER — ATORVASTATIN CALCIUM 40 MG PO TABS
40.0000 mg | ORAL_TABLET | Freq: Every evening | ORAL | Status: DC
  Administered 2022-05-24: 40 mg via ORAL
  Filled 2022-05-24: qty 1

## 2022-05-24 MED ORDER — TRAMADOL HCL 50 MG PO TABS
50.0000 mg | ORAL_TABLET | Freq: Four times a day (QID) | ORAL | Status: DC | PRN
  Administered 2022-05-24 – 2022-05-25 (×2): 50 mg via ORAL
  Filled 2022-05-24 (×2): qty 1

## 2022-05-24 MED ORDER — ENOXAPARIN SODIUM 40 MG/0.4ML IJ SOSY
40.0000 mg | PREFILLED_SYRINGE | INTRAMUSCULAR | Status: DC
  Administered 2022-05-25: 40 mg via SUBCUTANEOUS
  Filled 2022-05-24: qty 0.4

## 2022-05-24 MED ORDER — POTASSIUM CHLORIDE IN NACL 20-0.9 MEQ/L-% IV SOLN
INTRAVENOUS | Status: DC
  Filled 2022-05-24: qty 1000

## 2022-05-24 MED ORDER — METHOCARBAMOL 500 MG PO TABS
500.0000 mg | ORAL_TABLET | Freq: Four times a day (QID) | ORAL | Status: DC | PRN
  Administered 2022-05-24: 500 mg via ORAL
  Filled 2022-05-24: qty 1

## 2022-05-24 MED ORDER — ACETAMINOPHEN 500 MG PO TABS
ORAL_TABLET | ORAL | Status: AC
  Filled 2022-05-24: qty 2

## 2022-05-24 MED ORDER — SODIUM CHLORIDE 0.9 % IV SOLN
INTRAVENOUS | Status: DC | PRN
  Administered 2022-05-24: 500 mL

## 2022-05-24 MED ORDER — METFORMIN HCL 500 MG PO TABS
1000.0000 mg | ORAL_TABLET | Freq: Two times a day (BID) | ORAL | Status: DC
  Administered 2022-05-24: 1000 mg via ORAL
  Filled 2022-05-24: qty 2

## 2022-05-24 MED ORDER — DEXAMETHASONE SODIUM PHOSPHATE 4 MG/ML IJ SOLN
INTRAMUSCULAR | Status: DC | PRN
  Administered 2022-05-24: 4 mg via INTRAVENOUS

## 2022-05-24 MED ORDER — PHENYLEPHRINE HCL (PRESSORS) 10 MG/ML IV SOLN
INTRAVENOUS | Status: DC | PRN
  Administered 2022-05-24 (×3): 80 ug via INTRAVENOUS

## 2022-05-24 SURGICAL SUPPLY — 88 items
ADH SKN CLS APL DERMABOND .7 (GAUZE/BANDAGES/DRESSINGS) ×1
ALLOGRAFT PERF 16X20 1.6+/-0.4 (Tissue) IMPLANT
APL PRP STRL LF DISP 70% ISPRP (MISCELLANEOUS) ×2
APL SKNCLS STERI-STRIP NONHPOA (GAUZE/BANDAGES/DRESSINGS)
APPLIER CLIP 9.375 MED OPEN (MISCELLANEOUS)
APR CLP MED 9.3 20 MLT OPN (MISCELLANEOUS)
BAG DECANTER FOR FLEXI CONT (MISCELLANEOUS) ×1 IMPLANT
BENZOIN TINCTURE PRP APPL 2/3 (GAUZE/BANDAGES/DRESSINGS) IMPLANT
BINDER BREAST LRG (GAUZE/BANDAGES/DRESSINGS) IMPLANT
BINDER BREAST MEDIUM (GAUZE/BANDAGES/DRESSINGS) IMPLANT
BINDER BREAST XLRG (GAUZE/BANDAGES/DRESSINGS) IMPLANT
BINDER BREAST XXLRG (GAUZE/BANDAGES/DRESSINGS) IMPLANT
BLADE CLIPPER SURG (BLADE) IMPLANT
BLADE HEX COATED 2.75 (ELECTRODE) ×1 IMPLANT
BLADE SURG 10 STRL SS (BLADE) ×1 IMPLANT
BLADE SURG 15 STRL LF DISP TIS (BLADE) ×1 IMPLANT
BLADE SURG 15 STRL SS (BLADE) ×1
BNDG GAUZE DERMACEA FLUFF 4 (GAUZE/BANDAGES/DRESSINGS) ×2 IMPLANT
BNDG GZE DERMACEA 4 6PLY (GAUZE/BANDAGES/DRESSINGS) ×2
CANISTER SUCT 1200ML W/VALVE (MISCELLANEOUS) ×1 IMPLANT
CHLORAPREP W/TINT 26 (MISCELLANEOUS) ×2 IMPLANT
CLIP APPLIE 9.375 MED OPEN (MISCELLANEOUS) IMPLANT
COVER BACK TABLE 60X90IN (DRAPES) ×1 IMPLANT
COVER MAYO STAND STRL (DRAPES) ×2 IMPLANT
COVER PROBE W GEL 5X96 (DRAPES) ×1 IMPLANT
DERMABOND ADVANCED .7 DNX12 (GAUZE/BANDAGES/DRESSINGS) ×1 IMPLANT
DRAIN CHANNEL 15F RND FF W/TCR (WOUND CARE) IMPLANT
DRAIN CHANNEL 19F RND (DRAIN) IMPLANT
DRAPE INCISE IOBAN 66X45 STRL (DRAPES) IMPLANT
DRAPE TOP ARMCOVERS (MISCELLANEOUS) ×1 IMPLANT
DRAPE U-SHAPE 76X120 STRL (DRAPES) ×1 IMPLANT
DRAPE UTILITY XL STRL (DRAPES) ×1 IMPLANT
DRSG TEGADERM 2-3/8X2-3/4 SM (GAUZE/BANDAGES/DRESSINGS) IMPLANT
DRSG TEGADERM 4X10 (GAUZE/BANDAGES/DRESSINGS) IMPLANT
ELECT BLADE 4.0 EZ CLEAN MEGAD (MISCELLANEOUS)
ELECT COATED BLADE 2.86 ST (ELECTRODE) ×1 IMPLANT
ELECT REM PT RETURN 9FT ADLT (ELECTROSURGICAL) ×1
ELECTRODE BLDE 4.0 EZ CLN MEGD (MISCELLANEOUS) IMPLANT
ELECTRODE REM PT RTRN 9FT ADLT (ELECTROSURGICAL) ×1 IMPLANT
EVACUATOR SILICONE 100CC (DRAIN) IMPLANT
EXPANDER TISSUE FV FOURTE 400 (Prosthesis & Implant Plastic) IMPLANT
GAUZE PAD ABD 8X10 STRL (GAUZE/BANDAGES/DRESSINGS) ×1 IMPLANT
GAUZE SPONGE 4X4 12PLY STRL (GAUZE/BANDAGES/DRESSINGS) IMPLANT
GAUZE SPONGE 4X4 12PLY STRL LF (GAUZE/BANDAGES/DRESSINGS) IMPLANT
GLOVE BIO SURGEON STRL SZ 6 (GLOVE) ×1 IMPLANT
GLOVE BIO SURGEON STRL SZ7 (GLOVE) ×1 IMPLANT
GLOVE BIOGEL PI IND STRL 7.5 (GLOVE) ×1 IMPLANT
GOWN STRL REUS W/ TWL LRG LVL3 (GOWN DISPOSABLE) ×2 IMPLANT
GOWN STRL REUS W/TWL LRG LVL3 (GOWN DISPOSABLE) ×2
LIGHT WAVEGUIDE WIDE FLAT (MISCELLANEOUS) IMPLANT
MARKER SKIN DUAL TIP RULER LAB (MISCELLANEOUS) IMPLANT
NDL HYPO 25X1 1.5 SAFETY (NEEDLE) ×1 IMPLANT
NDL SAFETY ECLIP 18X1.5 (MISCELLANEOUS) ×1 IMPLANT
NEEDLE HYPO 25X1 1.5 SAFETY (NEEDLE) ×1 IMPLANT
NS IRRIG 1000ML POUR BTL (IV SOLUTION) IMPLANT
PACK BASIN DAY SURGERY FS (CUSTOM PROCEDURE TRAY) ×1 IMPLANT
PENCIL SMOKE EVACUATOR (MISCELLANEOUS) ×1 IMPLANT
PIN SAFETY STERILE (MISCELLANEOUS) IMPLANT
PUNCH BIOPSY 4MM DISP (MISCELLANEOUS) IMPLANT
SHEET MEDIUM DRAPE 40X70 STRL (DRAPES) ×1 IMPLANT
SLEEVE SCD COMPRESS KNEE MED (STOCKING) ×1 IMPLANT
SPIKE FLUID TRANSFER (MISCELLANEOUS) ×1 IMPLANT
SPONGE T-LAP 18X18 ~~LOC~~+RFID (SPONGE) ×2 IMPLANT
STAPLER VISISTAT 35W (STAPLE) ×1 IMPLANT
STRIP CLOSURE SKIN 1/2X4 (GAUZE/BANDAGES/DRESSINGS) IMPLANT
SUT CHROMIC 4 0 PS 2 18 (SUTURE) IMPLANT
SUT ETHILON 2 0 FS 18 (SUTURE) IMPLANT
SUT MNCRL AB 4-0 PS2 18 (SUTURE) IMPLANT
SUT PDS AB 2-0 CT2 27 (SUTURE) IMPLANT
SUT SILK 2 0 SH (SUTURE) IMPLANT
SUT VIC AB 3-0 SH 27 (SUTURE)
SUT VIC AB 3-0 SH 27X BRD (SUTURE) IMPLANT
SUT VICRYL 0 CT-2 (SUTURE) IMPLANT
SUT VICRYL 3-0 CR8 SH (SUTURE) IMPLANT
SUT VICRYL 4-0 PS2 18IN ABS (SUTURE) IMPLANT
SUT VLOC 180 0 24IN GS25 (SUTURE) IMPLANT
SYR 10ML LL (SYRINGE) ×1 IMPLANT
SYR 50ML LL SCALE MARK (SYRINGE) ×1 IMPLANT
SYR BULB IRRIG 60ML STRL (SYRINGE) ×1 IMPLANT
SYR CONTROL 10ML LL (SYRINGE) ×1 IMPLANT
TAPE MEASURE VINYL STERILE (MISCELLANEOUS) IMPLANT
TISSUE EXPNDR FV FOURTE 400 (Prosthesis & Implant Plastic) ×1 IMPLANT
TOWEL GREEN STERILE FF (TOWEL DISPOSABLE) ×1 IMPLANT
TRACER MAGTRACE VIAL (MISCELLANEOUS) IMPLANT
TRAY DSU PREP LF (CUSTOM PROCEDURE TRAY) IMPLANT
TUBE CONNECTING 20X1/4 (TUBING) ×1 IMPLANT
UNDERPAD 30X36 HEAVY ABSORB (UNDERPADS AND DIAPERS) ×2 IMPLANT
YANKAUER SUCT BULB TIP NO VENT (SUCTIONS) ×1 IMPLANT

## 2022-05-24 NOTE — Anesthesia Preprocedure Evaluation (Addendum)
Anesthesia Evaluation  Patient identified by MRN, date of birth, ID band Patient awake    Reviewed: Allergy & Precautions, NPO status , Patient's Chart, lab work & pertinent test results  Airway Mallampati: II  TM Distance: >3 FB Neck ROM: Full    Dental no notable dental hx.    Pulmonary neg pulmonary ROS, former smoker,    Pulmonary exam normal        Cardiovascular hypertension, Pt. on medications and Pt. on home beta blockers  Rhythm:Regular Rate:Normal     Neuro/Psych negative neurological ROS  negative psych ROS   GI/Hepatic negative GI ROS, Neg liver ROS,   Endo/Other  diabetes, Type 2, Oral Hypoglycemic AgentsHypothyroidism   Renal/GU negative Renal ROS  negative genitourinary   Musculoskeletal  (+) Arthritis , DCIS left breast    Abdominal Normal abdominal exam  (+)   Peds  Hematology   Anesthesia Other Findings   Reproductive/Obstetrics                            Anesthesia Physical Anesthesia Plan  ASA: 2  Anesthesia Plan: General and Regional   Post-op Pain Management: Regional block* and Tylenol PO (pre-op)*   Induction: Intravenous  PONV Risk Score and Plan: 3 and Ondansetron, Dexamethasone, Midazolam and Treatment may vary due to age or medical condition  Airway Management Planned: Mask and LMA  Additional Equipment: None  Intra-op Plan:   Post-operative Plan: Extubation in OR  Informed Consent: I have reviewed the patients History and Physical, chart, labs and discussed the procedure including the risks, benefits and alternatives for the proposed anesthesia with the patient or authorized representative who has indicated his/her understanding and acceptance.     Dental advisory given  Plan Discussed with: CRNA  Anesthesia Plan Comments:         Anesthesia Quick Evaluation

## 2022-05-24 NOTE — Interval H&P Note (Signed)
History and Physical Interval Note:  05/24/2022 6:49 AM  Leslie Potts  has presented today for surgery, with the diagnosis of LEFT BREAST DCIS.  The various methods of treatment have been discussed with the patient and family. After consideration of risks, benefits and other options for treatment, the patient has consented to  Procedure(s): LEFT TOTAL MASTECTOMY (Left) LEFT BREAST RECONSTRUCTION WITH PLACEMENT OF TISSUE EXPANDER AND ALLODERM (Left) as a surgical intervention.  The patient's history has been reviewed, patient examined, no change in status, stable for surgery.  I have reviewed the patient's chart and labs.  Questions were answered to the patient's satisfaction.     Arnoldo Hooker Makyah Lavigne

## 2022-05-24 NOTE — Op Note (Signed)
Operative Note   DATE OF OPERATION: 9.22.23  LOCATION: [facility]-observation  SURGICAL DIVISION: Plastic Surgery  PREOPERATIVE DIAGNOSES:  DCIS left breast  POSTOPERATIVE DIAGNOSES:  same  PROCEDURE:  1. Left breast reconstruction with tissue expander 2. Acellular dermis (Alloderm) to left chest   SURGEON: Irene Limbo MD MBA  ASSISTANT: none  ANESTHESIA:  General.   EBL: 30 ml for entire procedure  COMPLICATIONS: None immediate.   INDICATIONS FOR PROCEDURE:  The patient, Leslie Potts, is a 61 y.o. female born on 1960/11/19, is here for immediate prepectoral tissue expander based reconstruction following skin reduction mastectomy.   FINDINGS: Natrelle 133S FV-12-T 400 ml tissue expander placed, initial fill volume 150 ml air SN [id number]  DESCRIPTION OF PROCEDURE:  The patient was marked to mark sternal notch, chest midline, anterior axillary lines and inframammary folds. Patient was marked for skin reduction mastectomy with most superior portion nipple areola marked on breast meridian. Vertical limbs marked by breast displacement and set at 8 cm length. The patient was taken to the operating room. SCDs were placed and IV antibiotics were given. The patient's operative site was prepped and draped in a sterile fashion. A time out was performed and all information was confirmed to be correct. I assisted in mastectomy with retraction and exposure. Following completion of mastectomy, the lateral limbs for resection marked and area over lower pole preserved as inferiorly based dermal pedicle. Skin de epithelialized in this area.    The cavity was irrigated with saline solution containing Ancef, gentamicin, and Betadine. Hemostasis was ensured. A 19 Fr drain was placed in subcutaneous position laterally and a 15 Fr drain placed along inframammary fold. Each secured to skin with 2-0 nylon. The tissue expander was prepared on back table prior in insertion. The expander was filled  with air. Perforated acellular dermis was  draped over anterior surface expander. The ADM was then secured to itself over posterior surface of expander with 4-0 chromic. Redundant folds acellular dermis excised so that the ADM lay flat without folds over air filled expander. The expander was secured to medial insertion pectoralis with a 0 vicryl. The superior and lateral tabs also secured to pectoralis muscle with 0-vicryl. The ADM was secured to pectoralis muscle and chest wall along inferior border at inframammary fold with 0 V lock. Laterally the mastectomy flap over posterior axillary line was advanced anteriorly and the subcutaneous tissue and superficial fascia was secured to chest wall with 0-vicryl. The inferiorly based dermal pedicle was redraped superiorly over expander and acellular dermis and secured to pectoralis with interrupted 0-vicryl. Skin closure completed with 3-0 vicryl in fascial layer and 4-0 vicryl in dermis. Skin closure completed with 4-0 monocryl subcuticular and tissue adhesive. Tegaderms applied bilateral, followed by dry dressing and breast binder.   The patient was allowed to wake from anesthesia, extubated and taken to the recovery room in satisfactory condition.   SPECIMENS: none  DRAINS: 15 and 19 Fr JP in left breast reconstruction  Irene Limbo, MD [facility] Plastic & Reconstructive Surgery  Office/ physician access line after hours [phone]

## 2022-05-24 NOTE — H&P (Signed)
Subjective   Chief Complaint: Breast Cancer       History of Present Illness: Leslie Potts is a 61 y.o. female who is seen today as an office consultation at the request of Thedora Hinders FNP for evaluation of Breast Cancer .   This is a 61 year old female who recently underwent routine screening mammogram.  This revealed some suspicious calcifications in the left breast.  She underwent diagnostic left mammogram that revealed pleomorphic calcifications in the upper outer quadrant spanning 6 cm extending to just behind the nipple.  There is a separate 0.2 cm group of calcifications in the upper inner quadrant.  Both of these areas were biopsied.  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     She also had MRI yesterday that confirmed a 5.2 cm region of abnormal enhancement in the mid to anterior upper outer quadrant of the left breast (5.2 x 3.4 x 2.6 cm).  This extends to the skin near the nipple.  There is a separate 1 cm tubular enhancement 3 cm medial to the biopsy clip.   She has a sister who was diagnosed at age 63 with invasive lobular carcinoma of the left breast and DCIS of the right breast.  Her sister underwent bilateral mastectomies, chemotherapy, post-mastectomy radiation, and letrozole.  It does not appear that any genetic testing was performed on her sister.   The patient reports no symptoms associated with her breasts prior to her biopsies.   The patient smokes about five cigarettes per day.     Review of Systems: A complete review of systems was obtained from the patient.  I have reviewed this information and discussed as appropriate with the patient.  See HPI as well for other ROS.   Review of Systems  Constitutional: Negative.   HENT:  Positive for tinnitus.   Eyes: Negative.   Respiratory: Negative.    Cardiovascular: Negative.   Gastrointestinal:  Positive for nausea.  Genitourinary: Negative.   Musculoskeletal:  Negative.   Skin: Negative.   Neurological: Negative.   Endo/Heme/Allergies:  Bruises/bleeds easily.  Psychiatric/Behavioral: Negative.         Medical History: Past Medical History: Diagnosis        Date            Arthritis                        Diabetes mellitus without complication (CMS-HCC)              History of cancer                     Hypertension               Thyroid disease               Patient Active Problem List Diagnosis            Intraductal carcinoma in situ of left breast            Atherosclerotic heart disease of native coronary artery without angina pectoris            Essential hypertension            Hypercalcemia            Hypothyroidism            Hyperglycemia due to type 2 diabetes mellitus (CMS-HCC)  Tobacco use            Pure hypercholesterolemia            Type 2 diabetes mellitus without complications (CMS-HCC)     Past Surgical History: Procedure       Laterality         Date            knee arthoscopy                                  2006            shoulder arthroplasty  Left                   09/2020     Allergies Allergen           Reactions            Meloxicam       Rash     Current Outpatient Medications on File Prior to Visit Medication       Sig       Dispense         Refill            aspirin 81 MG EC tablet         1 tablet                                    levothyroxine (SYNTHROID) 112 MCG tablet           Take 1 tablet by mouth once daily                            atorvastatin (LIPITOR) 40 MG tablet  Take 1 tablet by mouth once daily                              calcium carbonate 600 mg calcium (1,500 mg) Tab tablet    1 tablet with meals                              carvediloL (COREG) 12.5 MG tablet  Take 1 tablet by mouth 2 (two) times daily                             cholecalciferol (VITAMIN D3) 2,000 unit tablet          Take by mouth                                    fenofibrate nanocrystallized  (TRICOR) 145 MG tablet          Take 1 tablet by mouth once daily                                lysine 1,000 mg Tab   Take by mouth  metFORMIN (GLUCOPHAGE) 1000 MG tablet        Take 1 tablet by mouth 2 (two) times daily                                 omega 3-dha-epa-fish oil (FISH OIL) 1,000 mg (120 mg-180 mg) Cap        1 capsule                                   OZEMPIC 1 mg/dose (4 mg/3 mL) pen injector         INJECT '1MG'$  SUBCUTANEOUSLY ONCE A WEEK AFTER FINISHING 0.'5MG'$  DOSE                         zinc gluconate 50 mg tablet    Take 50 mg by mouth once daily                      No current facility-administered medications on file prior to visit.     Family History Problem           Relation           Age of Onset            Stroke  Mother             High blood pressure (Hypertension)   Mother             Hyperlipidemia (Elevated cholesterol)            Mother             Coronary Artery Disease (Blocked arteries around heart)     Mother             Diabetes          Mother             Coronary Artery Disease (Blocked arteries around heart)     Father              Diabetes          Sister               Breast cancer  Sister               Stroke  Other               Coronary Artery Disease (Blocked arteries around heart)     Other        Social History   Tobacco Use Smoking Status           Every Day            Types: Cigarettes Smokeless Tobacco   Never     Social History   Socioeconomic History            Marital status:  Married Tobacco Use            Smoking status:          Every Day                         Types: Cigarettes            Smokeless tobacco:    Never Vaping Use  Vaping Use:    Unknown Substance and Sexual Activity            Alcohol use:    Yes            Drug use:        Never     Objective:     Vitals:             04/16/22 1613 BP:      124/70 Pulse:  82 Temp:  36.6 C (97.9 F) SpO2:   98% Weight:            64.6 kg (142 lb 6.4 oz) Height: 154.9 cm ('5\' 1"'$ )   Body mass index is 26.91 kg/m.   Physical Exam    Constitutional:  WDWN in NAD, conversant, no obvious deformities; lying in bed comfortably Eyes:  Pupils equal, round; sclera anicteric; moist conjunctiva; no lid lag HENT:  Oral mucosa moist; good dentition  Neck:  No masses palpated, trachea midline; no thyromegaly Lungs:  CTA bilaterally; normal respiratory effort Breasts:  symmetric, no nipple changes; no palpable masses or lymphadenopathy on either side CV:  Regular rate and rhythm; no murmurs; extremities well-perfused with no edema Abd:  +bowel sounds, soft, non-tender, no palpable organomegaly; no palpable hernias Musc:  Unable to assess gait; no apparent clubbing or cyanosis in extremities Lymphatic:  No palpable cervical or axillary lymphadenopathy Skin:  Warm, dry; no sign of jaundice Psychiatric - alert and oriented x 4; calm mood and affect     Labs, Imaging and Diagnostic Testing: CLINICAL DATA:  Recall from screening mammography, calcifications involving the UPPER OUTER QUADRANT of the RIGHT breast at anterior to middle depth and a separate small group of calcifications in the slight UPPER INNER QUADRANT at middle depth   EXAM: DIGITAL DIAGNOSTIC UNILATERAL LEFT MAMMOGRAM WITH CAD   TECHNIQUE: Left digital diagnostic mammography was performed. Mammographic images were processed with CAD.   COMPARISON:  Previous exam(s).   ACR Breast Density Category b: There are scattered areas of fibroglandular density.   FINDINGS: Spot magnification CC and mediolateral views of the calcifications and a full field mediolateral view were obtained.   There are pleomorphic calcifications involving the UPPER OUTER QUADRANT at anterior to middle depth which span approximately 6 cm, extending to just behind the nipple. There are associated suspicious linear and branching forms. There is no visible  associated mass.   A separate 0.2 cm group of coarse, likely dystrophic calcifications are present in the [place] at middle depth. There appears to be an associated very small isodense mass on the full field mediolateral tomosynthesis images.   IMPRESSION: 1. Suspicious calcifications involving the UPPER OUTER QUADRANT of the LEFT breast at anterior to middle depth spanning approximately 6 cm. 2. Indeterminate 0.2 cm group of calcifications involving the slight UPPER INNER QUADRANT of the LEFT breast at middle depth, possibly associated with a very small isodense mass.   RECOMMENDATION: Stereotactic tomosynthesis core needle biopsy of the suspicious calcifications in the UPPER OUTER QUADRANT and the 0.2 cm group of calcifications in the slight UPPER INNER QUADRANT.   I have discussed the findings and recommendations with the patient. The stereotactic tomosynthesis core needle biopsy procedure was discussed with the patient and her questions were answered. She wishes to proceed with the biopsies which have been scheduled by the Breast Imaging staff.   BI-RADS CATEGORY  5: Highly suggestive of malignancy.   Electronically Signed: By: Evangeline Dakin M.D. On: 03/15/2022 11:16  CLINICAL DATA:  61 year old female presenting for evaluation status post 2 stereotactic biopsies of the left breast for calcifications. One biopsy in the upper inner left breast (coil clip) demonstrated high-grade DCIS. Note that the biopsy marking clip is laterally displaced from the site of biopsy by about 3.1 cm. A second area of calcifications (ribbon clip) in the lateral anterior left breast also demonstrated high-grade DCIS.   EXAM: BILATERAL BREAST MRI WITH AND WITHOUT CONTRAST   TECHNIQUE: Multiplanar, multisequence MR images of both breasts were obtained prior to and following the intravenous administration of 6 ml of Gadavist   Three-dimensional MR images were rendered by  post-processing of the original MR data on an independent workstation. The three-dimensional MR images were interpreted, and findings are reported in the following complete MRI report for this study. Three dimensional images were evaluated at the independent interpreting workstation using the DynaCAD thin client.   COMPARISON:  No prior MRI available for comparison. Correlation made with prior mammogram images.   FINDINGS: Breast composition: b. Scattered fibroglandular tissue.   Background parenchymal enhancement: Mild   Right breast: No mass or abnormal enhancement.   Left breast: Susceptibility artifact from the laterally displaced coil shaped biopsy marking clip is seen in the superior central aspect of the left breast (series 7, image 67). Approximately 2-3 cm medial to this biopsy marking clip, there is an elongated peripherally enhancing lesion measuring approximately 1.0 cm, which may represent the site of the stereotactic biopsy (DCIS) and/or biopsy changes (series 7, image 77). Immediately superior to the location of this coil biopsy marking clip, there is heterogeneous clumped and branching non mass enhancement extending anteriorly and superiorly measuring 5.2 x 3.4 (sagittal measurements) x 2.6 cm (axial measurement lateral to medial). The ribbon shaped biopsy marking clip placed at the site of the anterolateral left breast in this region of enhancement is not well visualized by MRI. This enhancement extends to the base of the skin/nipple (series 13, image 62), though no definite nipple enhancement is visualized.   Lymph nodes: No abnormal appearing lymph nodes.   Ancillary findings:  None.   IMPRESSION: 1. There is a 5.2 cm region of abnormal enhancement in the mid to anterior upper outer quadrant of the left breast (complete measurement is 5.2 x 3.4 x 2.6 cm). The coil shaped biopsy marking clip, which was displaced from the site of biopsy in the medial  left breast marks the posteroinferior margin of this abnormal enhancement. The abnormality extends to the base of the skin surface near the nipple, though no definite nipple or skin enhancement is identified.   2. There is a 1 cm tubular enhancement approximately 3 cm medial to the coil shaped biopsy marking clip, which likely represents biopsy changes/possible residual DCIS from the stereotactic biopsy.   3.  No evidence of right breast malignancy.   4.  No evidence of axillary lymphadenopathy.   RECOMMENDATION: Continue treatment plan for known left breast DCIS.   BI-RADS CATEGORY  6: Known biopsy-proven malignancy.     Electronically Signed   By: Ammie Ferrier M.D.   On: 04/15/2022 11:33   Assessment and Plan: Diagnoses and all orders for this visit:   Intraductal carcinoma in situ of left breast   Genetics negative Oncology recommends adjuvant Tamoxifen after surgery.  Plan left mastectomy with immediate reconstruction by Dr. Iran Planas.  The surgical procedure has been discussed with the patient.  Potential risks, benefits, alternative treatments, and expected outcomes have been explained.  All of the  patient's questions at this time have been answered.  The likelihood of reaching the patient's treatment goal is good.  The patient understand the proposed surgical procedure and wishes to proceed.   Imogene Burn. Georgette Dover, MD, [facility] Surgery  General Surgery   05/24/2022 6:45 AM

## 2022-05-24 NOTE — Transfer of Care (Signed)
Immediate Anesthesia Transfer of Care Note  Patient: Leslie Potts  Procedure(s) Performed: LEFT TOTAL MASTECTOMY WITH MAGTRACE INJECTION (Left: Breast) LEFT BREAST RECONSTRUCTION WITH PLACEMENT OF TISSUE EXPANDER AND ALLODERM (Left: Breast)  Patient Location: PACU  Anesthesia Type:GA combined with regional for post-op pain  Level of Consciousness: awake, alert  and oriented  Airway & Oxygen Therapy: Patient Spontanous Breathing and Patient connected to face mask oxygen  Post-op Assessment: Report given to RN and Post -op Vital signs reviewed and stable  Post vital signs: Reviewed and stable  Last Vitals:  Vitals Value Taken Time  BP 115/71 05/24/22 0951  Temp    Pulse 75 05/24/22 0952  Resp 14 05/24/22 0952  SpO2 98 % 05/24/22 0952  Vitals shown include unvalidated device data.  Last Pain:  Vitals:   05/24/22 0639  TempSrc: Oral  PainSc: 1       Patients Stated Pain Goal: 8 (41/93/79 0240)  Complications: No notable events documented.

## 2022-05-24 NOTE — Op Note (Signed)
Preop diagnosis: Ductal carcinoma in situ left breast Postop diagnosis: Same Procedure performed: Left mastectomy with mag trace injection Surgeon:Salihah Peckham K Saxon Barich Anesthesia: General via LMA with pec block Indications: This is a 61 year old female who was recently diagnosed with a 6 cm area of calcifications in the central breast.  She had multiple biopsies that showed ductal carcinoma in situ high-grade with necrosis.  MRI confirmed the extent of disease.  Genetics was negative.  The patient seeks mastectomy with immediate reconstruction which will be performed by Dr. Iran Planas.  Description of procedure: The patient is brought to the operating room placed in the supine position on the operating room table.  After an adequate level general anesthesia was obtained, a timeout was taken.  We injected mag trace in the retroareolar space in the event that final pathology reveals invasive disease and she needs a sentinel lymph node biopsy.  Her entire breast was then prepped with ChloraPrep and draped in sterile fashion.  A timeout was taken to ensure the proper patient and proper procedure.  A skin reducing incision had been outlined by Dr. Iran Planas.  We made our incision around the nipple areolar complex with a scalpel.  We dissected down into the subcutaneous tissue with cautery.  We then raised skin flaps inferiorly to the inframammary crease, medially to the edge of the sternum, laterally to the anterior edge of the latissimus, and superiorly to the infraclavicular chest wall.  We then dissected the breast tissue off of the underlying pectoralis muscle from medial to lateral, taking the anterior layer fascia with the specimen.  We remove the entire specimen and oriented with a long suture lateral, short suture superior, and a long single stitch in the axilla.  This was sent for pathology.  We inspected carefully for hemostasis.  We then turned the case over to plastic surgery for reconstruction.  The  surgical procedure has been discussed with the patient.  Potential risks, benefits, alternative treatments, and expected outcomes have been explained.  All of the patient's questions at this time have been answered.  The likelihood of reaching the patient's treatment goal is good.  The patient understand the proposed surgical procedure and wishes to proceed.  Imogene Burn. Georgette Dover, MD, [facility] Surgery  General Surgery   05/24/2022 8:38 AM

## 2022-05-24 NOTE — Anesthesia Postprocedure Evaluation (Signed)
Anesthesia Post Note  Patient: Leslie Potts  Procedure(s) Performed: LEFT TOTAL MASTECTOMY WITH MAGTRACE INJECTION (Left: Breast) LEFT BREAST RECONSTRUCTION WITH PLACEMENT OF TISSUE EXPANDER AND ALLODERM (Left: Breast)     Patient location during evaluation: PACU Anesthesia Type: Regional and General Level of consciousness: awake and alert Pain management: pain level controlled Vital Signs Assessment: post-procedure vital signs reviewed and stable Respiratory status: spontaneous breathing, nonlabored ventilation, respiratory function stable and patient connected to nasal cannula oxygen Cardiovascular status: blood pressure returned to baseline and stable Postop Assessment: no apparent nausea or vomiting Anesthetic complications: no   No notable events documented.  Last Vitals:  Vitals:   05/24/22 1032 05/24/22 1100  BP:  132/66  Pulse:  73  Resp:  14  Temp:  36.9 C  SpO2: 93% 94%    Last Pain:  Vitals:   05/24/22 1100  TempSrc:   PainSc: 2                  Jamario Colina P Dynisha Due

## 2022-05-24 NOTE — Anesthesia Procedure Notes (Signed)
Procedure Name: LMA Insertion Date/Time: 05/24/2022 7:33 AM  Performed by: Maryella Shivers, CRNAPre-anesthesia Checklist: Patient identified, Emergency Drugs available, Suction available and Patient being monitored Patient Re-evaluated:Patient Re-evaluated prior to induction Oxygen Delivery Method: Circle system utilized Preoxygenation: Pre-oxygenation with 100% oxygen Induction Type: IV induction Ventilation: Mask ventilation without difficulty LMA: LMA inserted LMA Size: 4.0 Number of attempts: 1 Airway Equipment and Method: Bite block Placement Confirmation: positive ETCO2 Tube secured with: Tape Dental Injury: Teeth and Oropharynx as per pre-operative assessment

## 2022-05-24 NOTE — Progress Notes (Signed)
Assisted Dr. Jana Half with left, pectoralis, ultrasound guided block. Side rails up, monitors on throughout procedure. See vital signs in flow sheet. Tolerated Procedure well.

## 2022-05-25 DIAGNOSIS — D0512 Intraductal carcinoma in situ of left breast: Secondary | ICD-10-CM | POA: Diagnosis not present

## 2022-05-25 LAB — GLUCOSE, CAPILLARY: Glucose-Capillary: 126 mg/dL — ABNORMAL HIGH (ref 70–99)

## 2022-05-25 NOTE — Discharge Summary (Signed)
Physician Discharge Summary  Patient ID: Leslie Potts MRN: [id number] DOB/AGE: [birth year] 61 y.o.  Admit date: 05/24/2022 Discharge date: 05/25/2022  Admission Diagnoses: DCIS left breast  Discharge Diagnoses:  Principal Problem:   Ductal carcinoma in situ (DCIS) of left breast   Discharged Condition: stable  Hospital Course: Post operatively patient did well with pain controlled on oral medications, tolerating diet, and ambulatory with minimal assist. Instructed on bathing and drain care.  Treatments: surgery: left mastectomy breast reconstruction with tissue expander acellular dermis 9.22.23  Discharge Exam: Blood pressure 135/71, pulse 68, temperature 98.4 F (36.9 C), resp. rate 18, height '5\' 1"'$  (1.549 m), weight 62.8 kg, SpO2 97 %. Incision/Wound: incisions dry intact Tegaderms in place drains serosanguinous chest soft ecchymoses developing no hematoma  Disposition: Discharge disposition: 01-Home or Self Care       Discharge Instructions     Call MD for:  redness, tenderness, or signs of infection (pain, swelling, bleeding, redness, odor or green/yellow discharge around incision site)   Complete by: As directed    Call MD for:  severe or increased pain, loss or decreased feeling  in affected limb(s)   Complete by: As directed    Call MD for:  temperature >100.5   Complete by: As directed    Discharge instructions   Complete by: As directed    Ok to remove dressings and shower am 9.24.23. Soap and water ok, pat Tegaderms dry. Do not remove Tegaderms No creams or ointments over incisions. Do not let drains dangle in shower, attach to lanyard or similar.Strip and record drains twice daily and bring log to clinic visit.  Breast binder or soft compression bra all other times.  Ok to raise arms above shoulders for bathing and dressing.  No house yard work or exercise until cleared by MD.   Patient received all Rx preop. Recommend ibuprofen with meals to aid with  pain control. Also ok to use Tylenol for pain. Recommend Miralax or Dulcolax as needed for constipation.   Driving Restrictions   Complete by: As directed    No driving for 2 weeks then no driving if taking prescription pain medication   Lifting restrictions   Complete by: As directed    No lifting > 5-10 lbs until cleared by MD   Resume previous diet   Complete by: As directed       Allergies as of 05/25/2022       Reactions   Meloxicam Rash        Medication List     STOP taking these medications    aspirin EC 81 MG tablet       TAKE these medications    atorvastatin 40 MG tablet Commonly known as: LIPITOR Take 40 mg by mouth every evening.   calcium carbonate 1500 (600 Ca) MG Tabs tablet Commonly known as: OSCAL Take 600 mg by mouth 2 (two) times daily.   carvedilol 12.5 MG tablet Commonly known as: COREG Take 12.5 mg by mouth in the morning and at bedtime.   fenofibrate 145 MG tablet Commonly known as: TRICOR Take 145 mg by mouth daily.   FISH OIL PO Take 1 capsule by mouth daily.   levothyroxine 112 MCG tablet Commonly known as: SYNTHROID Take 112 mcg by mouth daily.   Lysine HCl 1000 MG Tabs Take 1,000 mg by mouth daily.   metFORMIN 1000 MG tablet Commonly known as: GLUCOPHAGE Take 1,000 mg by mouth 2 (two) times daily.   multivitamin with minerals  Tabs tablet Take 1 tablet by mouth every evening.   Opcon-A 0.027-0.315 % Soln Generic drug: Naphazoline-Pheniramine Place 1 drop into both eyes daily. Allergy relief   Ozempic (0.25 or 0.5 MG/DOSE) 2 MG/1.5ML Sopn Generic drug: Semaglutide(0.25 or 0.'5MG'$ /DOS) Inject 0.5 mg into the skin every Wednesday.   tamoxifen 10 MG tablet Commonly known as: NOLVADEX Take 1 tablet (10 mg total) by mouth daily.   Vitamin D3 50 MCG (2000 UT) Tabs Take 4,000 Units by mouth every evening.   Zinc 50 MG Tabs Take 50 mg by mouth daily.        Follow-up Information     Irene Limbo, MD Follow up  in 1 week(s).   Specialty: Plastic Surgery Why: as scheduled Contact information: [address] [place] [address] [phone]         Donnie Mesa, MD. Schedule an appointment as soon as possible for a visit in 2 week(s).   Specialty: General Surgery Contact information: [address] [place] [address] [phone]                 Signed: Irene Limbo 05/25/2022, 7:42 AM

## 2022-05-27 ENCOUNTER — Encounter (HOSPITAL_BASED_OUTPATIENT_CLINIC_OR_DEPARTMENT_OTHER): Payer: Self-pay | Admitting: Surgery

## 2022-05-27 LAB — SURGICAL PATHOLOGY

## 2022-05-27 MED ORDER — ROPIVACAINE HCL 5 MG/ML IJ SOLN
INTRAMUSCULAR | Status: DC | PRN
  Administered 2022-05-24: 30 mL via PERINEURAL

## 2022-05-27 NOTE — Anesthesia Procedure Notes (Signed)
  Anesthesia Regional Block: Pectoralis block   Pre-Anesthetic Checklist: , timeout performed,  Correct Patient, Correct Site, Correct Laterality,  Correct Procedure, Correct Position, site marked,  Risks and benefits discussed,  Surgical consent,  Pre-op evaluation,  At surgeon's request and post-op pain management  Laterality: Left  Prep: Dura Prep       Needles:  Injection technique: Single-shot  Needle Type: Echogenic Stimulator Needle     Needle Length: 5cm  Needle Gauge: 20     Additional Needles:   Procedures:,,,, ultrasound used (permanent image in chart),,    Narrative:  Start time: 05/24/2022 7:20 AM End time: 05/24/2022 7:23 AM Injection made incrementally with aspirations every 5 mL.  Performed by: Personally  Anesthesiologist: Darral Dash, DO  Additional Notes: Patient identified. Risks/Benefits/Options discussed with patient including but not limited to bleeding, infection, nerve damage, failed block, incomplete pain control. Patient expressed understanding and wished to proceed. All questions were answered. Sterile technique was used throughout the entire procedure. Please see nursing notes for vital signs. Aspirated in 5cc intervals with injection for negative confirmation. Patient was given instructions on fall risk and not to get out of bed. All questions and concerns addressed with instructions to call with any issues or inadequate analgesia.

## 2022-05-27 NOTE — Addendum Note (Signed)
Addendum  created 05/27/22 1706 by Darral Dash, DO   Child order released for a procedure order, Clinical Note Signed, Intraprocedure Blocks edited, Intraprocedure Meds edited, SmartForm saved

## 2022-06-04 ENCOUNTER — Encounter: Payer: Self-pay | Admitting: *Deleted

## 2022-06-19 ENCOUNTER — Other Ambulatory Visit: Payer: Self-pay | Admitting: Hematology and Oncology

## 2022-06-25 NOTE — Therapy (Signed)
OUTPATIENT PHYSICAL THERAPY ONCOLOGY EVALUATION  Patient Name: Leslie Potts MRN: [id number] DOB:01-16-1961, 61 y.o., female Today's Date: 06/26/2022   PT End of Session - 06/26/22 0859     Visit Number 1    Number of Visits 12    Date for PT Re-Evaluation 08/07/22    PT Start Time 0900    PT Stop Time 0948    PT Time Calculation (min) 48 min    Activity Tolerance Patient tolerated treatment well    Behavior During Therapy [facility] for tasks assessed/performed             Past Medical History:  Diagnosis Date   Arthritis    Diabetes mellitus without complication ([place])    Family history of adverse reaction to anesthesia    her mother hard time waking up    Family history of breast cancer 04/24/2022   Hyperlipidemia    Hypertension    Hypothyroidism    Past Surgical History:  Procedure Laterality Date   BREAST RECONSTRUCTION WITH PLACEMENT OF TISSUE EXPANDER AND ALLODERM Left 05/24/2022   Procedure: LEFT BREAST RECONSTRUCTION WITH PLACEMENT OF TISSUE EXPANDER AND ALLODERM;  Surgeon: Irene Limbo, MD;  Location: [facility];  Service: Plastics;  Laterality: Left;   KNEE ARTHROSCOPY Left    ORIF HUMERUS FRACTURE Left 09/28/2020   Procedure: OPEN REDUCTION INTERNAL FIXATION (ORIF) PROXIMAL HUMERUS FRACTURE;  Surgeon: Verner Mould, MD;  Location: [facility];  Service: Orthopedics;  Laterality: Left;  NEEDS RNFA   TOTAL MASTECTOMY Left 05/24/2022   Procedure: LEFT TOTAL MASTECTOMY WITH MAGTRACE INJECTION;  Surgeon: Donnie Mesa, MD;  Location: [facility];  Service: General;  Laterality: Left;   Patient Active Problem List   Diagnosis Date Noted   Genetic testing 05/02/2022   Ductal carcinoma in situ (DCIS) of left breast 04/24/2022   Family history of breast cancer 04/24/2022    PCP: Holland Commons, FNP  REFERRING PROVIDER: Irene Limbo, MD  REFERRING DIAG: Post left Mastectomy  THERAPY DIAG:  Status post mastectomy,  left - Plan: PT plan of care cert/re-cert  Abnormal posture - Plan: PT plan of care cert/re-cert  Muscle weakness (generalized)  ONSET DATE: 03/29/2022  Rationale for Evaluation and Treatment Rehabilitation  SUBJECTIVE:                                                                                                                                                                                           SUBJECTIVE STATEMENT: I have been feeling good since surgery. I have discomfort around the perimeter of the expander. My ROM is OK. I got the last  drain out 2 weeks ago. I haven't done any exercises and she is just allowing me to this week. I fractured the left proximal humerus in 2022 and have a metal plate. I have to keep it moving or it gets stiff. I feel like something has shifted in there and it catches some and makes noise more now. She had eschar removal on 06/11/2022 and it has healed pretty well. I have had ongoing soreness under the shoulder blade but I know how to get it to go away.  I returned to work yesterday. Expander exchange scheduled for Dec 19 tentatively with right mastopexy   PERTINENT HISTORY:  MMG with left breast calcifications. Diagnostic MMG showed calcifications left UO spanning 6 cm. A 0.2 cm group of calcifications involving the left UIQ noted, possibly associated with a small mass. Biopsies labeled left breast medial with high grade DCIS ER+/PR-, left breast lateral microscopic focus high grade DCIS. MRI demonstrated a 5.2 cm region of abnormal enhancement in the left UOQ with biopsy clip. The abnormality extends to the base of the skin surface near the nipple, though no definite nipple or skin enhancement identified.  There is a separate 1 cm tubular enhancement 3 cm medial to the biopsy clip. Given span of disease mastectomy recommended. Tamoxifen started while surgical planning and genetics completed.  Pt underwent left total mastectomy with immediate reconstruction  with tissue expander on 05/24/2022. No LN's removed. She had additional surgery on 06/11/2022 for Eschar removal and closure PAIN:  Are you having pain? No NPRS scale: 0/10  PRECAUTIONS: Other: DM HBP, hypothyroidism, PRIOR LEFT PROX HUMERUS FX WITH ORIF  WEIGHT BEARING RESTRICTIONS: No  FALLS:  Has patient fallen in last 6 months? Yes. Number of falls 1  LIVING ENVIRONMENT: Lives with: lives with their spouse Lives in: House/apartment Stairs: Yes; Internal: 12 steps; on right going up Has following equipment at home: None  OCCUPATION: Customer service work for an environmental [employer]  LEISURE: walking,read  HAND DOMINANCE: right   PRIOR LEVEL OF FUNCTION: Independent  PATIENT GOALS: Not sure;MD wanted her to come for atleast 1 visit.   OBJECTIVE:  COGNITION: Overall cognitive status: Within functional limits for tasks assessed   PALPATION: Tender left lats/scapular area, levator scap  OBSERVATIONS / OTHER ASSESSMENTS: scab full length of T junction with mild drainage at distal T. Using pad in her bra. Scapular compensation on left with AROM.  Excess tissue noted at left axillary region but does not appear to be swelling  SENSATION: Light touch: Deficits     POSTURE: forward head, rounded shoulders  UPPER EXTREMITY AROM/PROM:  A/PROM RIGHT   eval   Shoulder extension 46  Shoulder flexion 157  Shoulder abduction 165  Shoulder internal rotation 67  Shoulder external rotation 95    (Blank rows = not tested)  A/PROM LEFT   eval  Shoulder extension 50  Shoulder flexion 152  Shoulder abduction 130 normal s/p ORIF  Shoulder internal rotation WNL  Shoulder external rotation C7 normal since ORIF    (Blank rows = not tested)  CERVICAL AROM: All within functional limits:    UPPER EXTREMITY STRENGTH: WFL   LYMPHEDEMA ASSESSMENTS:   SURGERY TYPE/DATE: Left total mastectomy with immediate reconstruction with tissue expander  NUMBER OF LYMPH NODES REMOVED:  0  CHEMOTHERAPY: No  RADIATION:NO  HORMONE TREATMENT: yes, Tamoxifen  INFECTIONS: NO, only had preventative antibiotics with surgery  QUICK DASH SURVEY: 6.82   TODAY'S TREATMENT:  DATE:  Educated and performed 4 post op exercises especially wall walk for abd. Advised to watch for left scapular compensation and not to force through pain. Pt is s/p left ORIF for a proximal humerus fx.  PATIENT EDUCATION:  Education details: 4 post op exercises, POC Person educated: Patient Education method: Consulting civil engineer, Demonstration, and Handouts Education comprehension: verbalized understanding and returned demonstration  HOME EXERCISE PROGRAM: 4 post op exercises  ASSESSMENT:  CLINICAL IMPRESSION: Patient is a 61 y.o. female who was seen today for physical therapy evaluation and treatment s/p Left mastectomy with immediate reconstruction for DCIS on 05/24/2022.  She is also s/p a left proximal humerus ORIF 1 year ago and is having some crepitus/catching. ROM is quite good, but she does have tenderness in the left UQ and would benefit from soft tissue mobilization in addition to strengthening/stabilization of the left shoulder/scapular muscles to decrease compensation and from wound checks prn.  OBJECTIVE IMPAIRMENTS: decreased ROM, decreased strength, impaired UE functional use, and postural dysfunction.   ACTIVITY LIMITATIONS: lifting, reach over head, and hygiene/grooming  PARTICIPATION LIMITATIONS:  able to do most if not having to use left UE  PERSONAL FACTORS: 1-2 comorbidities: Left Mastectomy, Left proximal humerus ORIF  are also affecting patient's functional outcome.   REHAB POTENTIAL: Excellent  CLINICAL DECISION MAKING: Stable/uncomplicated  EVALUATION COMPLEXITY: Low  GOALS: Goals reviewed with patient? Yes    LONG TERM GOALS: Target date: 08/07/2022     Pt will be independent with HEP for left shoulder ROM and strength Baseline:  Goal status: INITIAL  2.  Pt will improve AROM abd by 10-20 degrees for improved reaching ability Baseline:  Goal status: INITIAL  3.  Pt will have decreased muscular tightness/tenderness by 50% Baseline:  Goal status: INITIAL  4.  Pt will have decreased complaints of left shoulder crepitus/catching by 25-50% Baseline:  Goal status: INITIAL   PLAN:  PT FREQUENCY: 1-2x/week  PT DURATION: 6 weeks  PLANNED INTERVENTIONS: Therapeutic exercises, Neuromuscular re-education, Patient/Family education, Self Care, Dry Needling, scar mobilization, and Manual therapy  PLAN FOR NEXT SESSION: REMINDER(HAD PRIOR LEFT PROX HUMERUS ORIF)STM to left UQ, PROM esp. Abd, gentle strength ABC's, punches etc progresssing to TB to strengthen scapular muscles and decrease compensation   Claris Pong, PT 06/26/2022, 12:11 PM

## 2022-06-26 ENCOUNTER — Other Ambulatory Visit: Payer: Self-pay

## 2022-06-26 ENCOUNTER — Ambulatory Visit: Payer: 59 | Attending: Plastic Surgery

## 2022-06-26 DIAGNOSIS — M6281 Muscle weakness (generalized): Secondary | ICD-10-CM | POA: Insufficient documentation

## 2022-06-26 DIAGNOSIS — R293 Abnormal posture: Secondary | ICD-10-CM | POA: Insufficient documentation

## 2022-06-26 DIAGNOSIS — Z9012 Acquired absence of left breast and nipple: Secondary | ICD-10-CM | POA: Diagnosis not present

## 2022-07-01 NOTE — Progress Notes (Signed)
Patient Care Team: Holland Commons, FNP as PCP - General (Internal Medicine) Mauro Kaufmann, RN as Oncology Nurse Navigator Rockwell Germany, RN as Oncology Nurse Navigator Nicholas Lose, MD as Consulting Physician (Hematology and Oncology)  DIAGNOSIS: No diagnosis found.  SUMMARY OF ONCOLOGIC HISTORY: Oncology History  Ductal carcinoma in situ (DCIS) of left breast  03/21/2022 Initial Diagnosis   Screening mammogram detected left breast calcifications, breast MRI revealed 5.2 cm abnormal enhancement in the left breast extending to the skin below the nipple, biopsy medial and lateral aspect: Revealed high-grade DCIS with necrosis ER 10%, PR 0%   04/24/2022 Cancer Staging   Staging form: Breast, AJCC 8th Edition - Clinical: Stage 0 (cTis (DCIS), cN0, cM0, ER+, PR-, HER2: Not Assessed) - Signed by Nicholas Lose, MD on 04/24/2022 Nuclear grade: G3   05/01/2022 Genetic Testing   Negative hereditary cancer genetic testing: no pathogenic variants detected in [facility] Panel.  Report date is May 01, 2022.   The BRCAplus panel offered by [employer] and includes sequencing and deletion/duplication analysis for the following 8 genes: ATM, BRCA1, BRCA2, CDH1, CHEK2, PALB2, PTEN, and TP53.  Pan-cancer panel pending.      CHIEF COMPLIANT: Follow-up after surgery  INTERVAL HISTORY: Leslie Potts is a 61 y.o. female is here because of recent diagnosis of left breast DCIS. She presents to the clinic for a follow-up and  come up with an adjuvant treatment plan.   ALLERGIES:  is allergic to meloxicam.  MEDICATIONS:  Current Outpatient Medications  Medication Sig Dispense Refill   atorvastatin (LIPITOR) 40 MG tablet Take 40 mg by mouth every evening.     calcium carbonate (OSCAL) 1500 (600 Ca) MG TABS tablet Take 600 mg by mouth 2 (two) times daily.     carvedilol (COREG) 12.5 MG tablet Take 12.5 mg by mouth in the morning and at bedtime.     Cholecalciferol (VITAMIN D3) 50  MCG (2000 UT) TABS Take 4,000 Units by mouth every evening.     fenofibrate (TRICOR) 145 MG tablet Take 145 mg by mouth daily.     levothyroxine (SYNTHROID) 112 MCG tablet Take 112 mcg by mouth daily.     Lysine HCl 1000 MG TABS Take 1,000 mg by mouth daily.     metFORMIN (GLUCOPHAGE) 1000 MG tablet Take 1,000 mg by mouth 2 (two) times daily.     Multiple Vitamin (MULTIVITAMIN WITH MINERALS) TABS tablet Take 1 tablet by mouth every evening.     Naphazoline-Pheniramine (OPCON-A) 0.027-0.315 % SOLN Place 1 drop into both eyes daily. Allergy relief     Omega-3 Fatty Acids (FISH OIL PO) Take 1 capsule by mouth daily.     Semaglutide,0.25 or 0.5MG/DOS, (OZEMPIC, 0.25 OR 0.5 MG/DOSE,) 2 MG/1.5ML SOPN Inject 0.5 mg into the skin every Wednesday.     tamoxifen (NOLVADEX) 10 MG tablet TAKE 1 TABLET(10 MG) BY MOUTH DAILY 30 tablet 1   Zinc 50 MG TABS Take 50 mg by mouth daily.     No current facility-administered medications for this visit.    PHYSICAL EXAMINATION: ECOG PERFORMANCE STATUS: {CHL ONC ECOG PS:[phone]}  There were no vitals filed for this visit. There were no vitals filed for this visit.  BREAST:*** No palpable masses or nodules in either right or left breasts. No palpable axillary supraclavicular or infraclavicular adenopathy no breast tenderness or nipple discharge. (exam performed in the presence of a chaperone)  LABORATORY DATA:  I have reviewed the data as listed  Latest Ref Rng & Units 05/15/2022   10:30 AM 09/27/2020   10:30 AM  CMP  Glucose 70 - 99 mg/dL 149  174   BUN 6 - 20 mg/dL 11  10   Creatinine 0.44 - 1.00 mg/dL 0.56  0.51   Sodium 135 - 145 mmol/L 136  138   Potassium 3.5 - 5.1 mmol/L 4.5  4.0   Chloride 98 - 111 mmol/L 107  104   CO2 22 - 32 mmol/L 22  23   Calcium 8.9 - 10.3 mg/dL 9.6  8.8   Total Protein 6.5 - 8.1 g/dL  6.3   Total Bilirubin 0.3 - 1.2 mg/dL  0.4   Alkaline Phos 38 - 126 U/L  59   AST 15 - 41 U/L  27   ALT 0 - 44 U/L  27     Lab  Results  Component Value Date   WBC 8.6 09/27/2020   HGB 11.1 (L) 09/27/2020   HCT 32.1 (L) 09/27/2020   MCV 95.0 09/27/2020   PLT 358 09/27/2020    ASSESSMENT & PLAN:  No problem-specific Assessment & Plan notes found for this encounter.    No orders of the defined types were placed in this encounter.  The patient has a good understanding of the overall plan. she agrees with it. she will call with any problems that may develop before the next visit here. Total time spent: 30 mins including face to face time and time spent for planning, charting and co-ordination of care   Suzzette Righter, [place] 07/01/22    I Gardiner Coins am scribing for Dr. Lindi Adie  ***

## 2022-07-04 ENCOUNTER — Inpatient Hospital Stay: Payer: 59 | Attending: Hematology and Oncology | Admitting: Hematology and Oncology

## 2022-07-04 ENCOUNTER — Telehealth: Payer: Self-pay | Admitting: Genetic Counselor

## 2022-07-04 ENCOUNTER — Ambulatory Visit: Payer: Self-pay | Admitting: Genetic Counselor

## 2022-07-04 ENCOUNTER — Other Ambulatory Visit: Payer: Self-pay

## 2022-07-04 VITALS — BP 142/75 | HR 86 | Temp 97.7°F | Resp 18 | Ht 61.0 in | Wt 141.2 lb

## 2022-07-04 DIAGNOSIS — Z9012 Acquired absence of left breast and nipple: Secondary | ICD-10-CM | POA: Diagnosis not present

## 2022-07-04 DIAGNOSIS — Z803 Family history of malignant neoplasm of breast: Secondary | ICD-10-CM

## 2022-07-04 DIAGNOSIS — D0512 Intraductal carcinoma in situ of left breast: Secondary | ICD-10-CM

## 2022-07-04 DIAGNOSIS — Z1379 Encounter for other screening for genetic and chromosomal anomalies: Secondary | ICD-10-CM

## 2022-07-04 NOTE — Progress Notes (Signed)
HPI:   Leslie Potts was previously seen in the [facility] clinic due to a personal and family history of breast cancer and concerns regarding a hereditary predisposition to cancer.  Recent genetic testing results and recommendations are discussed in more detail below.  CANCER HISTORY:  Oncology History  Ductal carcinoma in situ (DCIS) of left breast  03/21/2022 Initial Diagnosis   Screening mammogram detected left breast calcifications, breast MRI revealed 5.2 cm abnormal enhancement in the left breast extending to the skin below the nipple, biopsy medial and lateral aspect: Revealed high-grade DCIS with necrosis ER 10%, PR 0%   04/24/2022 Cancer Staging   Staging form: Breast, AJCC 8th Edition - Clinical: Stage 0 (cTis (DCIS), cN0, cM0, ER+, PR-, HER2: Not Assessed) - Signed by Nicholas Lose, MD on 04/24/2022 Nuclear grade: G3   05/03/2022 Genetic Testing   Negative hereditary cancer genetic testing: no pathogenic variants detected in [facility] CustomNext-Cancer +RNAinsight Panel.  Report date is 05/03/2022.   The CancerNext gene panel offered by [employer] includes sequencing, rearrangement analysis, and RNA analysis for the following 36 genes:   APC, ATM, AXIN2, BARD1, BMPR1A, BRCA1, BRCA2, BRIP1, CDH1, CDK4, CDKN2A, CHEK2, DICER1, HOXB13, EPCAM, GREM1, MLH1, MSH2, MSH3, MSH6, MUTYH, NBN, NF1, NTHL1, PALB2, PMS2, POLD1, POLE, PTEN, RAD51C, RAD51D, RECQL, SMAD4, SMARCA4, STK11, and TP53.      FAMILY HISTORY:  We obtained a detailed, 4-generation family history.  Significant diagnoses are listed below:      Family History  Problem Relation Age of Onset   Breast cancer Sister 58        bilateral   Colon polyps Maternal Grandmother          unknown #   Colon polyps Paternal Grandfather          unknown #   Cancer Other          MGM's 4 brothers and 1 sister; unknown type       Leslie Potts reports that her niece may have had negative genetic testing previously.  No  report was available for review today. Leslie Potts is unaware of other previous family history of genetic testing for hereditary cancer risks. There is no reported Ashkenazi Jewish ancestry. There is no known consanguinity.    GENETIC TEST RESULTS:  The [employer] CustomNext-Cancer +RNAinsight Panel found no pathogenic mutations.   The CustomNext-Cancer+RNAinsight panel offered by [employer] [facility] includes sequencing and rearrangement analysis for the following 47 genes:  APC, ATM, AXIN2, BARD1, BMPR1A, BRCA1, BRCA2, BRIP1, CDH1, CDK4, CDKN2A, CHEK2, DICER1, EPCAM, GREM1, HOXB13, MEN1, MLH1, MSH2, MSH3, MSH6, MUTYH, NBN, NF1, NF2, NTHL1, PALB2, PMS2, POLD1, POLE, PTEN, RAD51C, RAD51D, RECQL, RET, SDHA, SDHAF2, SDHB, SDHC, SDHD, SMAD4, SMARCA4, STK11, TP53, TSC1, TSC2, and VHL.  RNA data is routinely analyzed for use in variant interpretation for all genes. .   The test report has been scanned into EPIC and is located under the Molecular Pathology section of the Results Review tab.  A portion of the result report is included below for reference. Genetic testing reported out on May 03, 2022.      Even though a pathogenic variant was not identified, possible explanations for the cancer in the family may include: There may be no hereditary risk for cancer in the family. The cancers in Leslie Potts and/or her family may be sporadic/familial or due to other genetic and environmental factors. There may be a gene mutation in one of these genes that current testing methods cannot detect  but that chance is small. There could be another gene that has not yet been discovered, or that we have not yet tested, that is responsible for the cancer diagnoses in the family.  It is also possible there is a hereditary cause for the cancer in the family that Leslie Potts did not inherit.   Therefore, it is important to remain in touch with cancer genetics in the future so that we can continue to offer Leslie Potts the  most up to date genetic testing.      ADDITIONAL GENETIC TESTING:  Leslie Potts genetic testing was fairly extensive.  If there are additional relevant genes identified to increase cancer risk that can be analyzed in the future, we would be happy to discuss and coordinate this testing at that time.    CANCER SCREENING RECOMMENDATIONS:  Leslie Potts test result is considered negative (normal).  This means that we have not identified a hereditary cause for her personal history of DCIS at this time.   An individual's cancer risk and medical management are not determined by genetic test results alone. Overall cancer risk assessment incorporates additional factors, including personal medical history, family history, and any available genetic information that may result in a personalized plan for cancer prevention and surveillance. Therefore, it is recommended she continue to follow the cancer management and screening guidelines provided by her oncology and primary healthcare provider.  RECOMMENDATIONS FOR FAMILY MEMBERS:   Individuals in this family might be at some increased risk of developing cancer, over the general population risk, due to the family history of cancer.  Individuals in the family should notify their providers of the family history of cancer. We recommend women in this family have a yearly mammogram beginning at age 20, or 54 years younger than the earliest onset of cancer, an annual clinical breast exam, and perform monthly breast self-exams.   Other members of the family may still carry a pathogenic variant in one of these genes that Leslie Potts did not inherit. Based on the family history, we recommend her sister, who was diagnosed with bilateral breast cancer, have genetic counseling and testing. Leslie Potts will let us know if we can be of any assistance in coordinating genetic counseling and/or testing for this family member.     FOLLOW-UP:  Cancer genetics is a rapidly  advancing field and it is possible that new genetic tests will be appropriate for her and/or her family members in the future. We encourage her to remain in contact with cancer genetics on an annual basis so we can update her personal and family histories and let her know of advances in cancer genetics that may benefit this family.    Cantrell Larouche M. Joette Catching, [place], [facility] Genetic Counselor [email or web address] (P) [phone]

## 2022-07-04 NOTE — Telephone Encounter (Signed)
Contacted patient to review negative pan-cancer panel genetic testing.  LVM with contact information requesting call back.

## 2022-07-04 NOTE — Assessment & Plan Note (Addendum)
03/29/2022: Screening mammogram detected left breast calcifications, breast MRI revealed 5.2 cm abnormal enhancement in the left breast extending to the skin below the nipple, biopsy medial and lateral aspect: Revealed high-grade DCIS with necrosis ER 10%, PR 0%  05/24/2022: Left mastectomy: High-grade DCIS 2 cm with necrosis and calcifications, margins negative, ER 10%, PR 0%  Currently undergoing breast reconstruction process with Dr. Iran Planas.  Current treatment: Tamoxifen 10 mg daily started 04/24/2022 Tamoxifen toxicities: Denies any hot flashes arthralgias or myalgias.  Return to clinic in 3 months for survivorship care plan visit and after that I can see her once a year for follow-ups.

## 2022-07-05 ENCOUNTER — Telehealth: Payer: Self-pay | Admitting: Adult Health

## 2022-07-05 ENCOUNTER — Encounter: Payer: Self-pay | Admitting: *Deleted

## 2022-07-05 DIAGNOSIS — D0512 Intraductal carcinoma in situ of left breast: Secondary | ICD-10-CM

## 2022-07-05 NOTE — Telephone Encounter (Signed)
Scheduled appointment per 11/2 los. Left voicemail.

## 2022-07-09 ENCOUNTER — Ambulatory Visit: Payer: 59 | Attending: Plastic Surgery

## 2022-07-09 DIAGNOSIS — M6281 Muscle weakness (generalized): Secondary | ICD-10-CM | POA: Diagnosis present

## 2022-07-09 DIAGNOSIS — Z9012 Acquired absence of left breast and nipple: Secondary | ICD-10-CM | POA: Insufficient documentation

## 2022-07-09 DIAGNOSIS — R293 Abnormal posture: Secondary | ICD-10-CM | POA: Diagnosis present

## 2022-07-09 NOTE — Patient Instructions (Signed)
Over Head Pull: Narrow and Wide Grip   Cancer Rehab [phone]   On back, knees bent, feet flat, band across thighs, elbows straight but relaxed. Pull hands apart (start). Keeping elbows straight, bring arms up and over head, hands toward floor. Keep pull steady on band. Hold momentarily. Return slowly, keeping pull steady, back to start. Then do same with a wider grip on the band (past shoulder width) Repeat _5-10__ times. Band color __yellow____   Side Pull: Double Arm   On back, knees bent, feet flat. Arms perpendicular to body, shoulder level, elbows straight but relaxed. Pull arms out to sides, elbows straight. Resistance band comes across collarbones, hands toward floor. Hold momentarily. Slowly return to starting position. Repeat _5-10__ times. Band color _yellow____   Sword   On back, knees bent, feet flat, left hand on left hip, right hand above left. Pull right arm DIAGONALLY (hip to shoulder) across chest. Bring right arm along head toward floor. Hold momentarily. Slowly return to starting position. Repeat _5-10__ times. Do with left arm. Band color _yellow_____   Shoulder Rotation: Double Arm   On back, knees bent, feet flat, elbows tucked at sides, bent 90, hands palms up. Pull hands apart and down toward floor, keeping elbows near sides. Hold momentarily. Slowly return to starting position. Repeat _5-10__ times. Band color __yellow____    

## 2022-07-09 NOTE — Therapy (Signed)
OUTPATIENT PHYSICAL THERAPY ONCOLOGY TREATMENT  Patient Name: Leslie Potts MRN: [id number] DOB:1961-07-06, 61 y.o., female Today's Date: 07/09/2022   PT End of Session - 07/09/22 0805     Visit Number 2    Number of Visits 12    Date for PT Re-Evaluation 08/07/22    PT Start Time 0802    PT Stop Time 0855    PT Time Calculation (min) 53 min    Activity Tolerance Patient tolerated treatment well    Behavior During Therapy [facility] for tasks assessed/performed             Past Medical History:  Diagnosis Date   Arthritis    Diabetes mellitus without complication ([place])    Family history of adverse reaction to anesthesia    her mother hard time waking up    Family history of breast cancer 04/24/2022   Hyperlipidemia    Hypertension    Hypothyroidism    Past Surgical History:  Procedure Laterality Date   BREAST RECONSTRUCTION WITH PLACEMENT OF TISSUE EXPANDER AND ALLODERM Left 05/24/2022   Procedure: LEFT BREAST RECONSTRUCTION WITH PLACEMENT OF TISSUE EXPANDER AND ALLODERM;  Surgeon: Irene Limbo, MD;  Location: [place];  Service: Plastics;  Laterality: Left;   KNEE ARTHROSCOPY Left    ORIF HUMERUS FRACTURE Left 09/28/2020   Procedure: OPEN REDUCTION INTERNAL FIXATION (ORIF) PROXIMAL HUMERUS FRACTURE;  Surgeon: Verner Mould, MD;  Location: [place];  Service: Orthopedics;  Laterality: Left;  NEEDS RNFA   TOTAL MASTECTOMY Left 05/24/2022   Procedure: LEFT TOTAL MASTECTOMY WITH MAGTRACE INJECTION;  Surgeon: Donnie Mesa, MD;  Location: [place];  Service: General;  Laterality: Left;   Patient Active Problem List   Diagnosis Date Noted   Genetic testing 05/02/2022   Ductal carcinoma in situ (DCIS) of left breast 04/24/2022   Family history of breast cancer 04/24/2022    PCP: Holland Commons, FNP  REFERRING PROVIDER: Irene Limbo, MD  REFERRING DIAG: Post left Mastectomy  THERAPY DIAG:  Status post mastectomy,  left  Abnormal posture  Muscle weakness (generalized)  ONSET DATE: 03/29/2022  Rationale for Evaluation and Treatment Rehabilitation  SUBJECTIVE:                                                                                                                                                                                           SUBJECTIVE STATEMENT: Nothing new since I was here for my evaluation.    PERTINENT HISTORY:  MMG with left breast calcifications. Diagnostic MMG showed calcifications left UO spanning 6 cm. A 0.2 cm group of calcifications involving the left  UIQ noted, possibly associated with a small mass. Biopsies labeled left breast medial with high grade DCIS ER+/PR-, left breast lateral microscopic focus high grade DCIS. MRI demonstrated a 5.2 cm region of abnormal enhancement in the left UOQ with biopsy clip. The abnormality extends to the base of the skin surface near the nipple, though no definite nipple or skin enhancement identified.  There is a separate 1 cm tubular enhancement 3 cm medial to the biopsy clip. Given span of disease mastectomy recommended. Tamoxifen started while surgical planning and genetics completed.  Pt underwent left total mastectomy with immediate reconstruction with tissue expander on 05/24/2022. No LN's removed. She had additional surgery on 06/11/2022 for Eschar removal and closure PAIN:  Are you having pain? No NPRS scale: 0/10  PRECAUTIONS: Other: DM HBP, hypothyroidism, PRIOR LEFT PROX HUMERUS FX WITH ORIF  WEIGHT BEARING RESTRICTIONS: No  FALLS:  Has patient fallen in last 6 months? Yes. Number of falls 1  LIVING ENVIRONMENT: Lives with: lives with their spouse Lives in: House/apartment Stairs: Yes; Internal: 12 steps; on right going up Has following equipment at home: None  OCCUPATION: Customer service work for an environmental [employer]  LEISURE: walking,read  HAND DOMINANCE: right   PRIOR LEVEL OF FUNCTION: Independent  PATIENT  GOALS: Not sure;MD wanted her to come for atleast 1 visit.   OBJECTIVE:  COGNITION: Overall cognitive status: Within functional limits for tasks assessed   PALPATION: Tender left lats/scapular area, levator scap  OBSERVATIONS / OTHER ASSESSMENTS: scab full length of T junction with mild drainage at distal T. Using pad in her bra. Scapular compensation on left with AROM.  Excess tissue noted at left axillary region but does not appear to be swelling  SENSATION: Light touch: Deficits     POSTURE: forward head, rounded shoulders  UPPER EXTREMITY AROM/PROM:  A/PROM RIGHT   eval   Shoulder extension 46  Shoulder flexion 157  Shoulder abduction 165  Shoulder internal rotation 67  Shoulder external rotation 95    (Blank rows = not tested)  A/PROM LEFT   eval  Shoulder extension 50  Shoulder flexion 152  Shoulder abduction 130 normal s/p ORIF  Shoulder internal rotation WNL  Shoulder external rotation C7 normal since ORIF    (Blank rows = not tested)  CERVICAL AROM: All within functional limits:    UPPER EXTREMITY STRENGTH: WFL   LYMPHEDEMA ASSESSMENTS:   SURGERY TYPE/DATE: Left total mastectomy with immediate reconstruction with tissue expander  NUMBER OF LYMPH NODES REMOVED: 0  CHEMOTHERAPY: No  RADIATION:NO  HORMONE TREATMENT: yes, Tamoxifen  INFECTIONS: NO, only had preventative antibiotics with surgery  QUICK DASH SURVEY: 6.82   TODAY'S TREATMENT:                                                                                                                           DATE:  06/26/22: Educated and performed 4 post op exercises especially wall walk for abd. Advised to watch for left scapular  compensation and not to force through pain. Pt is s/p left ORIF for a proximal humerus fx.  07/09/22: Manual Therapy P/ROM: In Supine to Lt shoulder into flexion, abduction, D2, and IR/er all to pts tolerance and within her available ROM; she had no pain with any  motions and full ROM without discomfort STM: In Rt S/L to Lt lateral scapular border Scap Mobs: In Rt S/L to Lt scapula into protraction and retraction Therapeutic Exercises Supine scapular series with yellow theraband x10 with horz abd, and er then x5 for remaining flexion with narrow and wide grip, and D2 returning therapist demo for each and Vcs for correct UE positioning. Pt reports clicking with narrow grip so advised her to stop this one if this conts or worsens and she verbalized good understanding  PATIENT EDUCATION:  Education details: 4 post op exercises, POC; supine scapular series with yellow theraband Person educated: Patient Education method: Explanation, Demonstration, and Handouts Education comprehension: verbalized understanding and returned demonstration; will benefit from further review  HOME EXERCISE PROGRAM: 4 post op exercises  ASSESSMENT:  CLINICAL IMPRESSION: Patient is a 61 y.o. female who was seen today for physical therapy evaluation and treatment s/p Left mastectomy with immediate reconstruction for DCIS on 05/24/2022.  She is also s/p a left proximal humerus ORIF 1 year ago and is having some crepitus/catching. ROM is quite good, but she does have tenderness in the left UQ and would benefit from soft tissue mobilization in addition to strengthening/stabilization of the left shoulder/scapular muscles to decrease compensation and from wound checks prn.  OBJECTIVE IMPAIRMENTS: decreased ROM, decreased strength, impaired UE functional use, and postural dysfunction.   ACTIVITY LIMITATIONS: lifting, reach over head, and hygiene/grooming  PARTICIPATION LIMITATIONS:  able to do most if not having to use left UE  PERSONAL FACTORS: 1-2 comorbidities: Left Mastectomy, Left proximal humerus ORIF  are also affecting patient's functional outcome.   REHAB POTENTIAL: Excellent  CLINICAL DECISION MAKING: Stable/uncomplicated  EVALUATION COMPLEXITY: Low  GOALS: Goals  reviewed with patient? Yes    LONG TERM GOALS: Target date: 08/20/2022    Pt will be independent with HEP for left shoulder ROM and strength Baseline:  Goal status: INITIAL  2.  Pt will improve AROM abd by 10-20 degrees for improved reaching ability Baseline:  Goal status: INITIAL  3.  Pt will have decreased muscular tightness/tenderness by 50% Baseline:  Goal status: INITIAL  4.  Pt will have decreased complaints of left shoulder crepitus/catching by 25-50% Baseline:  Goal status: INITIAL   PLAN:  PT FREQUENCY: 1-2x/week  PT DURATION: 6 weeks  PLANNED INTERVENTIONS: Therapeutic exercises, Neuromuscular re-education, Patient/Family education, Self Care, Dry Needling, scar mobilization, and Manual therapy  PLAN FOR NEXT SESSION: REMINDER(HAD PRIOR LEFT PROX HUMERUS ORIF) review new HEP issued today and progress as able (add bil UE 3 way raises next) then cont STM to left UQ, PROM; focus on decrease scapular compensation with A/ROM Narda Amber, PTA 07/09/2022, 9:01 AM   Over Head Pull: Narrow and Wide Grip   Cancer Rehab [phone]   On back, knees bent, feet flat, band across thighs, elbows straight but relaxed. Pull hands apart (start). Keeping elbows straight, bring arms up and over head, hands toward floor. Keep pull steady on band. Hold momentarily. Return slowly, keeping pull steady, back to start. Then do same with a wider grip on the band (past shoulder width) Repeat _5-10__ times. Band color __yellow____   Side Pull: Double Arm   On  back, knees bent, feet flat. Arms perpendicular to body, shoulder level, elbows straight but relaxed. Pull arms out to sides, elbows straight. Resistance band comes across collarbones, hands toward floor. Hold momentarily. Slowly return to starting position. Repeat _5-10__ times. Band color _yellow____   Sword   On back, knees bent, feet flat, left hand on left hip, right hand above left. Pull right arm DIAGONALLY  (hip to shoulder) across chest. Bring right arm along head toward floor. Hold momentarily. Slowly return to starting position. Repeat _5-10__ times. Do with left arm. Band color _yellow_____   Shoulder Rotation: Double Arm   On back, knees bent, feet flat, elbows tucked at sides, bent 90, hands palms up. Pull hands apart and down toward floor, keeping elbows near sides. Hold momentarily. Slowly return to starting position. Repeat _5-10__ times. Band color __yellow____

## 2022-07-11 ENCOUNTER — Ambulatory Visit: Payer: 59

## 2022-07-11 DIAGNOSIS — M6281 Muscle weakness (generalized): Secondary | ICD-10-CM

## 2022-07-11 DIAGNOSIS — Z9012 Acquired absence of left breast and nipple: Secondary | ICD-10-CM

## 2022-07-11 DIAGNOSIS — R293 Abnormal posture: Secondary | ICD-10-CM

## 2022-07-11 NOTE — Therapy (Signed)
OUTPATIENT PHYSICAL THERAPY ONCOLOGY TREATMENT  Patient Name: Leslie Potts MRN: [id number] DOB:1961/02/21, 61 y.o., female Today's Date: 07/11/2022   PT End of Session - 07/11/22 5188     Visit Number 3    Number of Visits 12    Date for PT Re-Evaluation 08/07/22    PT Start Time 0803    PT Stop Time 0859    PT Time Calculation (min) 56 min    Activity Tolerance Patient tolerated treatment well    Behavior During Therapy [facility] for tasks assessed/performed             Past Medical History:  Diagnosis Date   Arthritis    Diabetes mellitus without complication ([place])    Family history of adverse reaction to anesthesia    her mother hard time waking up    Family history of breast cancer 04/24/2022   Hyperlipidemia    Hypertension    Hypothyroidism    Past Surgical History:  Procedure Laterality Date   BREAST RECONSTRUCTION WITH PLACEMENT OF TISSUE EXPANDER AND ALLODERM Left 05/24/2022   Procedure: LEFT BREAST RECONSTRUCTION WITH PLACEMENT OF TISSUE EXPANDER AND ALLODERM;  Surgeon: Irene Limbo, MD;  Location: [place];  Service: Plastics;  Laterality: Left;   KNEE ARTHROSCOPY Left    ORIF HUMERUS FRACTURE Left 09/28/2020   Procedure: OPEN REDUCTION INTERNAL FIXATION (ORIF) PROXIMAL HUMERUS FRACTURE;  Surgeon: Verner Mould, MD;  Location: [place];  Service: Orthopedics;  Laterality: Left;  NEEDS RNFA   TOTAL MASTECTOMY Left 05/24/2022   Procedure: LEFT TOTAL MASTECTOMY WITH MAGTRACE INJECTION;  Surgeon: Donnie Mesa, MD;  Location: [place];  Service: General;  Laterality: Left;   Patient Active Problem List   Diagnosis Date Noted   Genetic testing 05/02/2022   Ductal carcinoma in situ (DCIS) of left breast 04/24/2022   Family history of breast cancer 04/24/2022    PCP: Holland Commons, FNP  REFERRING PROVIDER: Irene Limbo, MD  REFERRING DIAG: Post left Mastectomy  THERAPY DIAG:  Status post mastectomy,  left  Abnormal posture  Muscle weakness (generalized)  ONSET DATE: 03/29/2022  Rationale for Evaluation and Treatment Rehabilitation  SUBJECTIVE:                                                                                                                                                                                           SUBJECTIVE STATEMENT: I've done the new exercises you gave me a few times and overall it's gone well. I just want to review and make sure my technique is correct. My Lt shoulder felt fine after the last session as  well, just some expected soreness.    PERTINENT HISTORY:  MMG with left breast calcifications. Diagnostic MMG showed calcifications left UO spanning 6 cm. A 0.2 cm group of calcifications involving the left UIQ noted, possibly associated with a small mass. Biopsies labeled left breast medial with high grade DCIS ER+/PR-, left breast lateral microscopic focus high grade DCIS. MRI demonstrated a 5.2 cm region of abnormal enhancement in the left UOQ with biopsy clip. The abnormality extends to the base of the skin surface near the nipple, though no definite nipple or skin enhancement identified.  There is a separate 1 cm tubular enhancement 3 cm medial to the biopsy clip. Given span of disease mastectomy recommended. Tamoxifen started while surgical planning and genetics completed.  Pt underwent left total mastectomy with immediate reconstruction with tissue expander on 05/24/2022. No LN's removed. She had additional surgery on 06/11/2022 for Eschar removal and closure PAIN:  Are you having pain? No NPRS scale: 0/10  PRECAUTIONS: Other: DM HBP, hypothyroidism, PRIOR LEFT PROX HUMERUS FX WITH ORIF  WEIGHT BEARING RESTRICTIONS: No  FALLS:  Has patient fallen in last 6 months? Yes. 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   OBJECTIVE:  COGNITION: Overall cognitive status: Within functional limits for tasks assessed   PALPATION: Tender left lats/scapular area, levator scap  OBSERVATIONS / OTHER ASSESSMENTS: scab full length of T junction with mild drainage at distal T. Using pad in her bra. Scapular compensation on left with AROM.  Excess tissue noted at left axillary region but does not appear to be swelling  SENSATION: Light touch: Deficits     POSTURE: forward head, rounded shoulders  UPPER EXTREMITY AROM/PROM:  A/PROM RIGHT   eval   Shoulder extension 46  Shoulder flexion 157  Shoulder abduction 165  Shoulder internal rotation 67  Shoulder external rotation 95    (Blank rows = not tested)  A/PROM LEFT   eval  Shoulder extension 50  Shoulder flexion 152  Shoulder abduction 130 normal s/p ORIF  Shoulder internal rotation WNL  Shoulder external rotation C7 normal since ORIF    (Blank rows = not tested)  CERVICAL AROM: All within functional limits:    UPPER EXTREMITY STRENGTH: WFL   LYMPHEDEMA ASSESSMENTS:   SURGERY TYPE/DATE: Left total mastectomy with immediate reconstruction with tissue expander  NUMBER OF LYMPH NODES REMOVED: 0  CHEMOTHERAPY: No  RADIATION:NO  HORMONE TREATMENT: yes, Tamoxifen  INFECTIONS: NO, only had preventative antibiotics with surgery  QUICK DASH SURVEY: 6.82   TODAY'S TREATMENT:  DATE:   07/11/22:  Therapeutic Exercises  Supine scapular series with yellow theraband x10 each for review. Min Vcs for correct UE position but overall pt did very well with these. Still some "clicking"  reported with OH motions so reminded pt to stop doing this if clicking persists. Pt verbalized understanding.  Supine and all with Lt UE: scapular protraction/retraction (punches) to ceiling with 3#, 2 x 10; flexion with 2# x10 and then into Rt S/L for er with 1# 2 x 10 all returning therapist demo             Manual Therapy P/ROM: In Supine to Lt shoulder into al planes but focused on IR/er where pt is the tightest and still most limited  STM: In Rt S/L to Lt lateral scapular border Scap Mobs: In Rt S/L to Lt scapula into protraction and retraction   07/09/22: Manual Therapy P/ROM: In Supine to Lt shoulder into flexion, abduction, D2, and IR/er all to pts tolerance and within her available ROM; she had no pain with any motions and full ROM without discomfort STM: In Rt S/L to Lt lateral scapular border Scap Mobs: In Rt S/L to Lt scapula into protraction and retraction Therapeutic Exercises Supine scapular series with yellow theraband x10 with horz abd, and er then x5 for remaining flexion with narrow and wide grip, and D2 returning therapist demo for each and Vcs for correct UE positioning. Pt reports clicking with narrow grip so advised her to stop this one if this conts or worsens and she verbalized good understanding  06/26/22: Educated and performed 4 post op exercises especially wall walk for abd. Advised to watch for left scapular compensation and not to force through pain. Pt is s/p left ORIF for a proximal humerus fx.  PATIENT EDUCATION:  Education details: 4 post op exercises, POC; supine scapular series with yellow theraband Person educated: Patient Education method: Explanation, Demonstration, and Handouts Education comprehension: verbalized understanding and returned demonstration; will benefit from further review  HOME EXERCISE PROGRAM: 4 post op exercises  ASSESSMENT:  CLINICAL IMPRESSION: Progressed pt today to include gentle strengthening in supine after reviewing new  HEP issued at last session. She did well with review of supine scap and reports not having any pain with activities today. She does still report "clicking" in Lt shoulder with OH motions, more so with theraband, but notes if she pays attention to correcting her scapular compensation, the click is gone. Then continued with manual therapy working to decrease Lt shoulder tightness at end motions, especially IR/er where she is tightest.   OBJECTIVE IMPAIRMENTS: decreased ROM, decreased strength, impaired UE functional use, and postural dysfunction.   ACTIVITY LIMITATIONS: lifting, reach over head, and hygiene/grooming  PARTICIPATION LIMITATIONS:  able to do most if not having to use left UE  PERSONAL FACTORS: 1-2 comorbidities: Left Mastectomy, Left proximal humerus ORIF  are also affecting patient's functional outcome.   REHAB POTENTIAL: Excellent  CLINICAL DECISION MAKING: Stable/uncomplicated  EVALUATION COMPLEXITY: Low  GOALS: Goals reviewed with patient? Yes    LONG TERM GOALS: Target date: 08/22/2022    Pt will be independent with HEP for left shoulder ROM and strength Baseline:  Goal status: INITIAL  2.  Pt will improve AROM abd by 10-20 degrees for improved reaching ability Baseline:  Goal status: INITIAL  3.  Pt will have decreased muscular tightness/tenderness by 50% Baseline:  Goal status: INITIAL  4.  Pt will have decreased complaints of left shoulder crepitus/catching by 25-50% Baseline:  Goal status: INITIAL   PLAN:  PT FREQUENCY: 1-2x/week  PT DURATION: 6 weeks  PLANNED INTERVENTIONS: Therapeutic exercises, Neuromuscular re-education, Patient/Family education, Self Care, Dry Needling, scar mobilization, and Manual therapy  PLAN FOR NEXT SESSION: REMINDER(HAD PRIOR LEFT PROX HUMERUS ORIF) Cont to progress HEP prn and as able (add bil UE 3 way raises next) then cont STM to left UQ, PROM; focus on decrease scapular compensation with A/ROM Narda Amber, PTA 07/11/2022, 9:00 AM   Over Head Pull: Narrow and Wide Grip   Cancer Rehab [phone]   On back, knees bent, feet flat, band across thighs, elbows straight but relaxed. Pull hands apart (start). Keeping elbows straight, bring arms up and over head, hands toward floor. Keep pull steady on band. Hold momentarily. Return slowly, keeping pull steady, back to start. Then do same with a wider grip on the band (past shoulder width) Repeat _5-10__ times. Band color __yellow____   Side Pull: Double Arm   On back, knees bent, feet flat. Arms perpendicular to body, shoulder level, elbows straight but relaxed. Pull arms out to sides, elbows straight. Resistance band comes across collarbones, hands toward floor. Hold momentarily. Slowly return to starting position. Repeat _5-10__ times. Band color _yellow____   Sword   On back, knees bent, feet flat, left hand on left hip, right hand above left. Pull right arm DIAGONALLY (hip to shoulder) across chest. Bring right arm along head toward floor. Hold momentarily. Slowly return to starting position. Repeat _5-10__ times. Do with left arm. Band color _yellow_____   Shoulder Rotation: Double Arm   On back, knees bent, feet flat, elbows tucked at sides, bent 90, hands palms up. Pull hands apart and down toward floor, keeping elbows near sides. Hold momentarily. Slowly return to starting position. Repeat _5-10__ times. Band color __yellow____

## 2022-07-15 ENCOUNTER — Ambulatory Visit: Payer: 59

## 2022-07-15 DIAGNOSIS — R293 Abnormal posture: Secondary | ICD-10-CM

## 2022-07-15 DIAGNOSIS — Z9012 Acquired absence of left breast and nipple: Secondary | ICD-10-CM

## 2022-07-15 DIAGNOSIS — M6281 Muscle weakness (generalized): Secondary | ICD-10-CM

## 2022-07-15 NOTE — Therapy (Signed)
OUTPATIENT PHYSICAL THERAPY ONCOLOGY TREATMENT  Patient Name: Leslie Potts MRN: [id number] DOB:1960-10-21, 61 y.o., female Today's Date: 07/15/2022   PT End of Session - 07/15/22 1202     Visit Number 4    Number of Visits 12    Date for PT Re-Evaluation 08/07/22    PT Start Time 1203    PT Stop Time 1300    PT Time Calculation (min) 57 min    Activity Tolerance Patient tolerated treatment well    Behavior During Therapy [facility] for tasks assessed/performed             Past Medical History:  Diagnosis Date   Arthritis    Diabetes mellitus without complication ([place])    Family history of adverse reaction to anesthesia    her mother hard time waking up    Family history of breast cancer 04/24/2022   Hyperlipidemia    Hypertension    Hypothyroidism    Past Surgical History:  Procedure Laterality Date   BREAST RECONSTRUCTION WITH PLACEMENT OF TISSUE EXPANDER AND ALLODERM Left 05/24/2022   Procedure: LEFT BREAST RECONSTRUCTION WITH PLACEMENT OF TISSUE EXPANDER AND ALLODERM;  Surgeon: Irene Limbo, MD;  Location: [place];  Service: Plastics;  Laterality: Left;   KNEE ARTHROSCOPY Left    ORIF HUMERUS FRACTURE Left 09/28/2020   Procedure: OPEN REDUCTION INTERNAL FIXATION (ORIF) PROXIMAL HUMERUS FRACTURE;  Surgeon: Verner Mould, MD;  Location: [place];  Service: Orthopedics;  Laterality: Left;  NEEDS RNFA   TOTAL MASTECTOMY Left 05/24/2022   Procedure: LEFT TOTAL MASTECTOMY WITH MAGTRACE INJECTION;  Surgeon: Donnie Mesa, MD;  Location: [place];  Service: General;  Laterality: Left;   Patient Active Problem List   Diagnosis Date Noted   Genetic testing 05/02/2022   Ductal carcinoma in situ (DCIS) of left breast 04/24/2022   Family history of breast cancer 04/24/2022    PCP: Holland Commons, FNP  REFERRING PROVIDER: Irene Limbo, MD  REFERRING DIAG: Post left Mastectomy  THERAPY DIAG:  Status post mastectomy,  left  Abnormal posture  Muscle weakness (generalized)  ONSET DATE: 03/29/2022  Rationale for Evaluation and Treatment Rehabilitation  SUBJECTIVE:                                                                                                                                                                                           SUBJECTIVE STATEMENT:  I had pain with they wide position with the band and the ER. I can do the narrow position without pain.   PERTINENT HISTORY:  MMG with left breast calcifications. Diagnostic MMG showed calcifications left UO  spanning 6 cm. A 0.2 cm group of calcifications involving the left UIQ noted, possibly associated with a small mass. Biopsies labeled left breast medial with high grade DCIS ER+/PR-, left breast lateral microscopic focus high grade DCIS. MRI demonstrated a 5.2 cm region of abnormal enhancement in the left UOQ with biopsy clip. The abnormality extends to the base of the skin surface near the nipple, though no definite nipple or skin enhancement identified.  There is a separate 1 cm tubular enhancement 3 cm medial to the biopsy clip. Given span of disease mastectomy recommended. Tamoxifen started while surgical planning and genetics completed.  Pt underwent left total mastectomy with immediate reconstruction with tissue expander on 05/24/2022. No LN's removed. She had additional surgery on 06/11/2022 for Eschar removal and closure PAIN:  Are you having pain? No NPRS scale: 0/10  PRECAUTIONS: Other: DM HBP, hypothyroidism, PRIOR LEFT PROX HUMERUS FX WITH ORIF  WEIGHT BEARING RESTRICTIONS: No  FALLS:  Has patient fallen in last 6 months? Yes. Number of falls 1  LIVING ENVIRONMENT: Lives with: lives with their spouse Lives in: House/apartment Stairs: Yes; Internal: 12 steps; on right going up Has following equipment at home: None  OCCUPATION: Customer service work for an environmental [employer]  LEISURE: walking,read  HAND DOMINANCE:  right   PRIOR LEVEL OF FUNCTION: Independent  PATIENT GOALS: Not sure;MD wanted her to come for atleast 1 visit.   OBJECTIVE:  COGNITION: Overall cognitive status: Within functional limits for tasks assessed   PALPATION: Tender left lats/scapular area, levator scap  OBSERVATIONS / OTHER ASSESSMENTS: scab full length of T junction with mild drainage at distal T. Using pad in her bra. Scapular compensation on left with AROM.  Excess tissue noted at left axillary region but does not appear to be swelling  SENSATION: Light touch: Deficits     POSTURE: forward head, rounded shoulders  UPPER EXTREMITY AROM/PROM:  A/PROM RIGHT   eval   Shoulder extension 46  Shoulder flexion 157  Shoulder abduction 165  Shoulder internal rotation 67  Shoulder external rotation 95    (Blank rows = not tested)  A/PROM LEFT   eval 07/15/2022  Shoulder extension 50   Shoulder flexion 152 156  Shoulder abduction 130 normal s/p ORIF 148 WITH ut compensation  Shoulder internal rotation WNL   Shoulder external rotation C7 normal since ORIF     (Blank rows = not tested)  CERVICAL AROM: All within functional limits:    UPPER EXTREMITY STRENGTH: WFL   LYMPHEDEMA ASSESSMENTS:   SURGERY TYPE/DATE: Left total mastectomy with immediate reconstruction with tissue expander  NUMBER OF LYMPH NODES REMOVED: 0  CHEMOTHERAPY: No  RADIATION:NO  HORMONE TREATMENT: yes, Tamoxifen  INFECTIONS: NO, only had preventative antibiotics with surgery  QUICK DASH SURVEY: 6.82   TODAY'S TREATMENT:  DATE:   07/15/2022  STM to Left pectorals, UT in supine and in SL to UT and scapular area with cocoa butter  PROM left shoulder flexion, scaption, abduction, ER Standing SB stretch to the right x 3 to stretch left lateral trunk. Supine bilateral AROM flexion, scaption, Horizontal abd x  5 Supine scapular series with yellow x 10 for all. VC to do in shorter ROM for ER, and to turn thumb for sword Supine and all with Lt UE: scapular protraction/retraction (punches) to ceiling with 3#, 2 x 10; flexion with 2# x10 and then into Rt S/L for er with 1# 2 x 10  Printed pics in [place] for exercises with wts and SB stretch   07/11/22:  Therapeutic Exercises  Supine scapular series with yellow theraband x10 each for review. Min Vcs for correct UE position but overall pt did very well with these. Still some "clicking" reported with OH motions so reminded pt to stop doing this if clicking persists. Pt verbalized understanding.  Supine and all with Lt UE: scapular protraction/retraction (punches) to ceiling with 3#, 2 x 10; flexion with 2# x10 and then into Rt S/L for er with 1# 2 x 10 all returning therapist demo             Manual Therapy P/ROM: In Supine to Lt shoulder into al planes but focused on IR/er where pt is the tightest and still most limited  STM: In Rt S/L to Lt lateral scapular border Scap Mobs: In Rt S/L to Lt scapula into protraction and retraction   07/09/22: Manual Therapy P/ROM: In Supine to Lt shoulder into flexion, abduction, D2, and IR/er all to pts tolerance and within her available ROM; she had no pain with any motions and full ROM without discomfort STM: In Rt S/L to Lt lateral scapular border Scap Mobs: In Rt S/L to Lt scapula into protraction and retraction Therapeutic Exercises Supine scapular series with yellow theraband x10 with horz abd, and er then x5 for remaining flexion with narrow and wide grip, and D2 returning therapist demo for each and Vcs for correct UE positioning. Pt reports clicking with narrow grip so advised her to stop this one if this conts or worsens and she verbalized good understanding  06/26/22: Educated and performed 4 post op exercises especially wall walk for abd. Advised to watch for left scapular compensation and not to force  through pain. Pt is s/p left ORIF for a proximal humerus fx.  PATIENT EDUCATION:  Education details: 4 post op exercises, POC; supine scapular series with yellow theraband, SL ER with 1#, supine flexion 2#, serratus punch 3# Person educated: Patient Education method: Explanation, Demonstration, and Handouts Education comprehension: verbalized understanding and returned demonstration; will benefit from further review  HOME EXERCISE PROGRAM: 4 post op exercises, supine scapular series, supine flex, punches, and SL ER, SB stretch  ASSESSMENT:  CLINICAL IMPRESSION:  Improved AROM for left shoulder flexion and abd with  UT compensation still noted with abd. Pt did well with strength exercises with mild modifications and had no pain. Exercises were updated with [employer].  OBJECTIVE IMPAIRMENTS: decreased ROM, decreased strength, impaired UE functional use, and postural dysfunction.   ACTIVITY LIMITATIONS: lifting, reach over head, and hygiene/grooming  PARTICIPATION LIMITATIONS:  able to do most if not having to use left UE  PERSONAL FACTORS: 1-2 comorbidities: Left Mastectomy, Left proximal humerus ORIF  are also affecting patient's functional outcome.   REHAB POTENTIAL: Excellent  CLINICAL DECISION MAKING: Stable/uncomplicated  EVALUATION  COMPLEXITY: Low  GOALS: Goals reviewed with patient? Yes    LONG TERM GOALS: Target date: 08/26/2022    Pt will be independent with HEP for left shoulder ROM and strength Baseline:  Goal status: INITIAL  2.  Pt will improve AROM abd by 10-20 degrees for improved reaching ability Baseline:  Goal status: INITIAL  3.  Pt will have decreased muscular tightness/tenderness by 50% Baseline:  Goal status: INITIAL  4.  Pt will have decreased complaints of left shoulder crepitus/catching by 25-50% Baseline:  Goal status: INITIAL   PLAN:  PT FREQUENCY: 1-2x/week  PT DURATION: 6 weeks  PLANNED INTERVENTIONS: Therapeutic exercises,  Neuromuscular re-education, Patient/Family education, Self Care, Dry Needling, scar mobilization, and Manual therapy  PLAN FOR NEXT SESSION: REMINDER(HAD PRIOR LEFT PROX HUMERUS ORIF) Cont to progress HEP prn and as able (add bil UE 3 way raises next) then cont STM to left UQ, PROM; focus on decrease scapular compensation with A/ROM TE   Claris Pong, PT 07/15/2022, 1:04 PM   Over Head Pull: Narrow and Wide Grip   Cancer Rehab [phone]   On back, knees bent, feet flat, band across thighs, elbows straight but relaxed. Pull hands apart (start). Keeping elbows straight, bring arms up and over head, hands toward floor. Keep pull steady on band. Hold momentarily. Return slowly, keeping pull steady, back to start. Then do same with a wider grip on the band (past shoulder width) Repeat _5-10__ times. Band color __yellow____   Side Pull: Double Arm   On back, knees bent, feet flat. Arms perpendicular to body, shoulder level, elbows straight but relaxed. Pull arms out to sides, elbows straight. Resistance band comes across collarbones, hands toward floor. Hold momentarily. Slowly return to starting position. Repeat _5-10__ times. Band color _yellow____   Sword   On back, knees bent, feet flat, left hand on left hip, right hand above left. Pull right arm DIAGONALLY (hip to shoulder) across chest. Bring right arm along head toward floor. Hold momentarily. Slowly return to starting position. Repeat _5-10__ times. Do with left arm. Band color _yellow_____   Shoulder Rotation: Double Arm   On back, knees bent, feet flat, elbows tucked at sides, bent 90, hands palms up. Pull hands apart and down toward floor, keeping elbows near sides. Hold momentarily. Slowly return to starting position. Repeat _5-10__ times. Band color __yellow____

## 2022-07-17 ENCOUNTER — Ambulatory Visit: Payer: 59

## 2022-07-17 DIAGNOSIS — Z9012 Acquired absence of left breast and nipple: Secondary | ICD-10-CM

## 2022-07-17 DIAGNOSIS — R293 Abnormal posture: Secondary | ICD-10-CM

## 2022-07-17 DIAGNOSIS — M6281 Muscle weakness (generalized): Secondary | ICD-10-CM

## 2022-07-17 NOTE — Therapy (Signed)
OUTPATIENT PHYSICAL THERAPY ONCOLOGY TREATMENT  Patient Name: Leslie Potts MRN: [id number] DOB:09-25-1960, 61 y.o., female Today's Date: 07/17/2022   PT End of Session - 07/17/22 1001     Visit Number 5    Number of Visits 12    Date for PT Re-Evaluation 08/07/22    PT Start Time 1002    PT Stop Time 1053    PT Time Calculation (min) 51 min    Activity Tolerance Patient tolerated treatment well    Behavior During Therapy [facility] for tasks assessed/performed             Past Medical History:  Diagnosis Date   Arthritis    Diabetes mellitus without complication ([place])    Family history of adverse reaction to anesthesia    her mother hard time waking up    Family history of breast cancer 04/24/2022   Hyperlipidemia    Hypertension    Hypothyroidism    Past Surgical History:  Procedure Laterality Date   BREAST RECONSTRUCTION WITH PLACEMENT OF TISSUE EXPANDER AND ALLODERM Left 05/24/2022   Procedure: LEFT BREAST RECONSTRUCTION WITH PLACEMENT OF TISSUE EXPANDER AND ALLODERM;  Surgeon: Irene Limbo, MD;  Location: [place];  Service: Plastics;  Laterality: Left;   KNEE ARTHROSCOPY Left    ORIF HUMERUS FRACTURE Left 09/28/2020   Procedure: OPEN REDUCTION INTERNAL FIXATION (ORIF) PROXIMAL HUMERUS FRACTURE;  Surgeon: Verner Mould, MD;  Location: [place];  Service: Orthopedics;  Laterality: Left;  NEEDS RNFA   TOTAL MASTECTOMY Left 05/24/2022   Procedure: LEFT TOTAL MASTECTOMY WITH MAGTRACE INJECTION;  Surgeon: Donnie Mesa, MD;  Location: [place];  Service: General;  Laterality: Left;   Patient Active Problem List   Diagnosis Date Noted   Genetic testing 05/02/2022   Ductal carcinoma in situ (DCIS) of left breast 04/24/2022   Family history of breast cancer 04/24/2022    PCP: Holland Commons, FNP  REFERRING PROVIDER: Irene Limbo, MD  REFERRING DIAG: Post left Mastectomy  THERAPY DIAG:  Status post mastectomy,  left  Abnormal posture  Muscle weakness (generalized)  ONSET DATE: 03/29/2022  Rationale for Evaluation and Treatment Rehabilitation  SUBJECTIVE:                                                                                                                                                                                           SUBJECTIVE STATEMENT:   I did fine after I was here last time. The shoulder has been doing well. I don't have any of the sense of tightness/tenderness at my left side/ back. If I keep the shoulder down I  dont feel any crepitus  PERTINENT HISTORY:  MMG with left breast calcifications. Diagnostic MMG showed calcifications left UO spanning 6 cm. A 0.2 cm group of calcifications involving the left UIQ noted, possibly associated with a small mass. Biopsies labeled left breast medial with high grade DCIS ER+/PR-, left breast lateral microscopic focus high grade DCIS. MRI demonstrated a 5.2 cm region of abnormal enhancement in the left UOQ with biopsy clip. The abnormality extends to the base of the skin surface near the nipple, though no definite nipple or skin enhancement identified.  There is a separate 1 cm tubular enhancement 3 cm medial to the biopsy clip. Given span of disease mastectomy recommended. Tamoxifen started while surgical planning and genetics completed.  Pt underwent left total mastectomy with immediate reconstruction with tissue expander on 05/24/2022. No LN's removed. She had additional surgery on 06/11/2022 for Eschar removal and closure PAIN:  Are you having pain? No NPRS scale: 0/10  PRECAUTIONS: Other: DM HBP, hypothyroidism, PRIOR LEFT PROX HUMERUS FX WITH ORIF  WEIGHT BEARING RESTRICTIONS: No  FALLS:  Has patient fallen in last 6 months? Yes. 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   OBJECTIVE:  COGNITION: Overall cognitive status: Within functional limits for tasks assessed   PALPATION: Tender left lats/scapular area, levator scap  OBSERVATIONS / OTHER ASSESSMENTS: scab full length of T junction with mild drainage at distal T. Using pad in her bra. Scapular compensation on left with AROM.  Excess tissue noted at left axillary region but does not appear to be swelling  SENSATION: Light touch: Deficits     POSTURE: forward head, rounded shoulders  UPPER EXTREMITY AROM/PROM:  A/PROM RIGHT   eval   Shoulder extension 46  Shoulder flexion 157  Shoulder abduction 165  Shoulder internal rotation 67  Shoulder external rotation 95    (Blank rows = not tested)  A/PROM LEFT   eval 07/15/2022  Shoulder extension 50   Shoulder flexion 152 156  Shoulder abduction 130 normal s/p ORIF 148 WITH ut compensation  Shoulder internal rotation WNL   Shoulder external rotation C7 normal since ORIF     (Blank rows = not tested)  CERVICAL AROM: All within functional limits:    UPPER EXTREMITY STRENGTH: WFL   LYMPHEDEMA ASSESSMENTS:   SURGERY TYPE/DATE: Left total mastectomy with immediate reconstruction with tissue expander  NUMBER OF LYMPH NODES REMOVED: 0  CHEMOTHERAPY: No  RADIATION:NO  HORMONE TREATMENT: yes, Tamoxifen  INFECTIONS: NO, only had preventative antibiotics with surgery  QUICK DASH SURVEY: 6.82   TODAY'S TREATMENT:  DATE:   07/17/2022 STM to Left pectorals, UT in supine and in SL to UT and scapular area with cocoa butter  PROM left shoulder flexion, scaption, abduction, ER Standing flexion x  10, scaption x 10, abd x 5 with back against wall 4 D ball stabs on wall x 10 ea Supine punches 3# 2x10, supine alphabet x 1   07/15/2022  STM to Left pectorals, UT in supine and in SL to UT and scapular area with cocoa butter  PROM left shoulder flexion, scaption, abduction, ER Standing SB stretch to the right x 3 to stretch left lateral trunk. Supine bilateral AROM flexion, scaption, Horizontal abd x 5 Supine scapular series with yellow x 10 for all. VC to do in shorter ROM for ER, and to turn thumb for sword Supine and all with Lt UE: scapular protraction/retraction (punches) to ceiling with 3#, 2 x 10; flexion with 2# x10 and then into Rt S/L for er with 1# 2 x 10  Printed pics in [place] for exercises with wts and SB stretch   07/11/22:  Therapeutic Exercises  Supine scapular series with yellow theraband x10 each for review. Min Vcs for correct UE position but overall pt did very well with these. Still some "clicking" reported with OH motions so reminded pt to stop doing this if clicking persists. Pt verbalized understanding.  Supine and all with Lt UE: scapular protraction/retraction (punches) to ceiling with 3#, 2 x 10; flexion with 2# x10 and then into Rt S/L for er with 1# 2 x 10 all returning therapist demo             Manual Therapy P/ROM: In Supine to Lt shoulder into al planes but focused on IR/er where pt is the tightest and still most limited  STM: In Rt S/L to Lt lateral scapular border Scap Mobs: In Rt S/L to Lt scapula into protraction and retraction   07/09/22: Manual Therapy P/ROM: In Supine to Lt shoulder into flexion, abduction, D2, and IR/er all to pts tolerance and within her available ROM; she had no pain with any motions and full ROM without discomfort STM: In Rt S/L to Lt lateral scapular border Scap Mobs: In Rt S/L to Lt scapula into protraction and retraction Therapeutic Exercises Supine scapular series with yellow theraband x10 with horz abd, and er then x5  for remaining flexion with narrow and wide grip, and D2 returning therapist demo for each and Vcs for correct UE positioning. Pt reports clicking with narrow grip so advised her to stop this one if this conts or worsens and she verbalized good understanding  06/26/22: Educated and performed 4 post op exercises especially wall walk for abd. Advised to watch for left scapular compensation and not to force through pain. Pt is s/p left ORIF for a proximal humerus fx.  PATIENT EDUCATION:  Education details: 4 post op exercises, POC; supine scapular series with yellow theraband, SL ER with 1#, supine flexion 2#, serratus punch 3# Person educated: Patient Education method: Explanation, Demonstration, and Handouts Education comprehension: verbalized understanding and returned demonstration; will benefit from further review  HOME EXERCISE PROGRAM: 4 post op exercises, supine scapular series, supine flex, punches, and SL ER, SB stretch  ASSESSMENT:  CLINICAL IMPRESSION:Pt is making good progress. "I haven't had any issues with functional things lately. I can sweep, pick up things, carry without any trouble" She is progressing nicely with her goals and should be ready for discharge next week. She still has some UT compensation  and fatigues with stab activities but has learned how to depress her scapula to prevent crepitus.  OBJECTIVE IMPAIRMENTS: decreased ROM, decreased strength, impaired UE functional use, and postural dysfunction.   ACTIVITY LIMITATIONS: lifting, reach over head, and hygiene/grooming  PARTICIPATION LIMITATIONS:  able to do most if not having to use left UE  PERSONAL FACTORS: 1-2 comorbidities: Left Mastectomy, Left proximal humerus ORIF  are also affecting patient's functional outcome.   REHAB POTENTIAL: Excellent  CLINICAL DECISION MAKING: Stable/uncomplicated  EVALUATION COMPLEXITY: Low  GOALS: Goals reviewed with patient? Yes    LONG TERM GOALS: Target date:  08/28/2022    Pt will be independent with HEP for left shoulder ROM and strength Baseline:  Goal status: MET  2.  Pt will improve AROM abd by 10-20 degrees for improved reaching ability Baseline:  Goal status: MET 07/17/2022  3.  Pt will have decreased muscular tightness/tenderness by 50% Baseline:  Goal status: INITIAL  4.  Pt will have decreased complaints of left shoulder crepitus/catching by 25-50% Baseline:  Goal status: INITIAL   PLAN:  PT FREQUENCY: 1-2x/week  PT DURATION: 6 weeks  PLANNED INTERVENTIONS: Therapeutic exercises, Neuromuscular re-education, Patient/Family education, Self Care, Dry Needling, scar mobilization, and Manual therapy  PLAN FOR NEXT SESSION: REMINDER(HAD PRIOR LEFT PROX HUMERUS ORIF)  DC if ready,Cont to progress HEP prn and as able (give pics add bil UE 3 way raises next) then cont STM to left UQ, PROM; focus on decrease scapular compensation with A/ROM Paul Dykes, PT 07/17/2022, 10:54 AM   Over Head Pull: Narrow and Wide Grip   Cancer Rehab [phone]   On back, knees bent, feet flat, band across thighs, elbows straight but relaxed. Pull hands apart (start). Keeping elbows straight, bring arms up and over head, hands toward floor. Keep pull steady on band. Hold momentarily. Return slowly, keeping pull steady, back to start. Then do same with a wider grip on the band (past shoulder width) Repeat _5-10__ times. Band color __yellow____   Side Pull: Double Arm   On back, knees bent, feet flat. Arms perpendicular to body, shoulder level, elbows straight but relaxed. Pull arms out to sides, elbows straight. Resistance band comes across collarbones, hands toward floor. Hold momentarily. Slowly return to starting position. Repeat _5-10__ times. Band color _yellow____   Sword   On back, knees bent, feet flat, left hand on left hip, right hand above left. Pull right arm DIAGONALLY (hip to shoulder) across chest. Bring right arm along head  toward floor. Hold momentarily. Slowly return to starting position. Repeat _5-10__ times. Do with left arm. Band color _yellow_____   Shoulder Rotation: Double Arm   On back, knees bent, feet flat, elbows tucked at sides, bent 90, hands palms up. Pull hands apart and down toward floor, keeping elbows near sides. Hold momentarily. Slowly return to starting position. Repeat _5-10__ times. Band color __yellow____

## 2022-07-23 ENCOUNTER — Ambulatory Visit: Payer: 59

## 2022-07-23 DIAGNOSIS — R293 Abnormal posture: Secondary | ICD-10-CM

## 2022-07-23 DIAGNOSIS — Z9012 Acquired absence of left breast and nipple: Secondary | ICD-10-CM

## 2022-07-23 DIAGNOSIS — M6281 Muscle weakness (generalized): Secondary | ICD-10-CM

## 2022-07-23 NOTE — Therapy (Signed)
OUTPATIENT PHYSICAL THERAPY ONCOLOGY TREATMENT  Patient Name: Leslie Potts MRN: [id number] DOB:05-30-1961, 61 y.o., female Today's Date: 07/23/2022   PT End of Session - 07/23/22 0753     Visit Number 6    Number of Visits 12    Date for PT Re-Evaluation 08/07/22    PT Start Time 0756    PT Stop Time 0840    PT Time Calculation (min) 44 min    Activity Tolerance Patient tolerated treatment well    Behavior During Therapy [facility] for tasks assessed/performed             Past Medical History:  Diagnosis Date   Arthritis    Diabetes mellitus without complication ([place])    Family history of adverse reaction to anesthesia    her mother hard time waking up    Family history of breast cancer 04/24/2022   Hyperlipidemia    Hypertension    Hypothyroidism    Past Surgical History:  Procedure Laterality Date   BREAST RECONSTRUCTION WITH PLACEMENT OF TISSUE EXPANDER AND ALLODERM Left 05/24/2022   Procedure: LEFT BREAST RECONSTRUCTION WITH PLACEMENT OF TISSUE EXPANDER AND ALLODERM;  Surgeon: Irene Limbo, MD;  Location: [place];  Service: Plastics;  Laterality: Left;   KNEE ARTHROSCOPY Left    ORIF HUMERUS FRACTURE Left 09/28/2020   Procedure: OPEN REDUCTION INTERNAL FIXATION (ORIF) PROXIMAL HUMERUS FRACTURE;  Surgeon: Verner Mould, MD;  Location: [place];  Service: Orthopedics;  Laterality: Left;  NEEDS RNFA   TOTAL MASTECTOMY Left 05/24/2022   Procedure: LEFT TOTAL MASTECTOMY WITH MAGTRACE INJECTION;  Surgeon: Donnie Mesa, MD;  Location: [place];  Service: General;  Laterality: Left;   Patient Active Problem List   Diagnosis Date Noted   Genetic testing 05/02/2022   Ductal carcinoma in situ (DCIS) of left breast 04/24/2022   Family history of breast cancer 04/24/2022    PCP: Holland Commons, FNP  REFERRING PROVIDER: Irene Limbo, MD  REFERRING DIAG: Post left Mastectomy  THERAPY DIAG:  Status post mastectomy,  left  Abnormal posture  Muscle weakness (generalized)  ONSET DATE: 03/29/2022  Rationale for Evaluation and Treatment Rehabilitation  SUBJECTIVE:                                                                                                                                                                                           SUBJECTIVE STATEMENT:   I feel ready to be discharged today.  I have been vacuuming and mopping, scrubbing the tub. I do have trouble reaching into my new washing machine and it hurts a little. I have expander exchange  on Dec. 19, 2023  PERTINENT HISTORY:  MMG with left breast calcifications. Diagnostic MMG showed calcifications left UO spanning 6 cm. A 0.2 cm group of calcifications involving the left UIQ noted, possibly associated with a small mass. Biopsies labeled left breast medial with high grade DCIS ER+/PR-, left breast lateral microscopic focus high grade DCIS. MRI demonstrated a 5.2 cm region of abnormal enhancement in the left UOQ with biopsy clip. The abnormality extends to the base of the skin surface near the nipple, though no definite nipple or skin enhancement identified.  There is a separate 1 cm tubular enhancement 3 cm medial to the biopsy clip. Given span of disease mastectomy recommended. Tamoxifen started while surgical planning and genetics completed.  Pt underwent left total mastectomy with immediate reconstruction with tissue expander on 05/24/2022. No LN's removed. She had additional surgery on 06/11/2022 for Eschar removal and closure PAIN:  Are you having pain? No NPRS scale: 0/10  PRECAUTIONS: Other: DM HBP, hypothyroidism, PRIOR LEFT PROX HUMERUS FX WITH ORIF  WEIGHT BEARING RESTRICTIONS: No  FALLS:  Has patient fallen in last 6 months? Yes. 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   OBJECTIVE:  COGNITION: Overall cognitive status: Within functional limits for tasks assessed   PALPATION: Tender left lats/scapular area, levator scap  OBSERVATIONS / OTHER ASSESSMENTS: scab full length of T junction with mild drainage at distal T. Using pad in her bra. Scapular compensation on left with AROM.  Excess tissue noted at left axillary region but does not appear to be swelling  SENSATION: Light touch: Deficits     POSTURE: forward head, rounded shoulders  UPPER EXTREMITY AROM/PROM:  A/PROM RIGHT   eval   Shoulder extension 46  Shoulder flexion 157  Shoulder abduction 165  Shoulder internal rotation 67  Shoulder external rotation 95    (Blank rows = not tested)  A/PROM LEFT   eval 07/15/2022  Shoulder extension 50   Shoulder flexion 152 156  Shoulder abduction 130 normal s/p ORIF 148 WITH out compensation  Shoulder internal rotation WNL   Shoulder external rotation C7 normal since ORIF     (Blank rows = not tested)  CERVICAL AROM: All within functional limits:    UPPER EXTREMITY STRENGTH: WFL   LYMPHEDEMA ASSESSMENTS:   SURGERY TYPE/DATE: Left total mastectomy with immediate reconstruction with tissue expander  NUMBER OF LYMPH NODES REMOVED: 0  CHEMOTHERAPY: No  RADIATION:NO  HORMONE TREATMENT: yes, Tamoxifen  INFECTIONS: NO, only had preventative antibiotics with surgery  QUICK DASH SURVEY: 6.82   TODAY'S TREATMENT:  DATE:  07/23/2022 STM to Left pectorals, UT in supine and in SL to UT and scapular area with cocoa butter  PROM left shoulder flexion, scaption, abduction, ER - Standing Shoulder  Flexion to 90 Degrees  -  1 sets - 10 reps - Standing Shoulder Scaption  -1 sets - 10 reps - Shoulder Abduction - Thumbs Up  -  10 reps - Doorway Pec Stretch at 60 Degrees Abduction with Arm Straight x 3 - Standing Shoulder Flexion to 90 Degrees  - 1 x daily - 3 x weekly - 1 sets - 10 reps - Standing Shoulder Scaption  - 1 x daily - 3 x weekly - 1 sets - 10 reps - Shoulder Abduction - Thumbs Up  - 1 x daily - 3 x weekly - 10 reps - Doorway Pec Stretch at 60 Degrees Abduction with Arm Straight Reviewed goals with pt. She has achieved all UPDATED HEP; Advised pt to use mirror as much as possible to assist with scapular depression Discussed STM to left pecs using her right hand  07/17/2022 STM to Left pectorals, UT in supine and in SL to UT and scapular area with cocoa butter  PROM left shoulder flexion, scaption, abduction, ER Standing flexion x 10, scaption x 10, abd x 5 with back against wall 4 D ball stabs on wall x 10 ea Supine punches 3# 2x10, supine alphabet x 1   07/15/2022  STM to Left pectorals, UT in supine and in SL to UT and scapular area with cocoa butter  PROM left shoulder flexion, scaption, abduction, ER Standing SB stretch to the right x 3 to stretch left lateral trunk. Supine bilateral AROM flexion, scaption, Horizontal abd x 5 Supine scapular series with yellow x 10 for all. VC to do in shorter ROM for ER, and to turn thumb for sword Supine and all with Lt UE: scapular protraction/retraction (punches) to ceiling with 3#, 2 x 10; flexion with 2# x10 and then into Rt S/L for er with 1# 2 x 10  Printed pics in [place] for exercises with wts and SB stretch   07/11/22:  Therapeutic Exercises  Supine scapular series with yellow theraband x10 each for review. Min Vcs for correct UE position but overall pt did very well with these. Still some "clicking" reported with OH motions so reminded pt to stop doing this if clicking persists. Pt verbalized understanding.  Supine and  all with Lt UE: scapular protraction/retraction (punches) to ceiling with 3#, 2 x 10; flexion with 2# x10 and then into Rt S/L for er with 1# 2 x 10 all returning therapist demo             Manual Therapy P/ROM: In Supine to Lt shoulder into al planes but focused on IR/er where pt is the tightest and still most limited  STM: In Rt S/L to Lt lateral scapular border Scap Mobs: In Rt S/L to Lt scapula into protraction and retraction   07/09/22: Manual Therapy P/ROM: In Supine to Lt shoulder into flexion, abduction, D2, and IR/er all to pts tolerance and within her available ROM; she had no pain with any motions and full ROM without discomfort STM: In Rt S/L to Lt lateral scapular border Scap Mobs: In Rt S/L to Lt scapula into protraction and retraction Therapeutic Exercises Supine scapular series with yellow theraband x10 with horz abd, and er then x5 for remaining flexion with narrow and wide grip, and D2 returning therapist demo for each and Vcs for  correct UE positioning. Pt reports clicking with narrow grip so advised her to stop this one if this conts or worsens and she verbalized good understanding  06/26/22: Educated and performed 4 post op exercises especially wall walk for abd. Advised to watch for left scapular compensation and not to force through pain. Pt is s/p left ORIF for a proximal humerus fx.  PATIENT EDUCATION:  Education details: 4 post op exercises, POC; supine scapular series with yellow theraband, SL ER with 1#, supine flexion 2#, serratus punch 3#Access Code: [email or web address] URL: [email or web address] Date: 07/23/2022 Prepared by: Cheral Almas  Exercises - Standing Shoulder Flexion to 90 Degrees  - 1 x daily - 3 x weekly - 1 sets - 10 reps - Standing Shoulder Scaption  - 1 x daily - 3 x weekly - 1 sets - 10 reps - Shoulder Abduction - Thumbs Up  - 1 x daily - 3 x weekly - 10 reps - Doorway Pec Stretch at 60 Degrees Abduction with Arm Straight  - 2 x daily - 7 x  weekly - 1 sets - 3 reps - 15-30 hold  Person educated: Patient Education method: Explanation, Demonstration, and Handouts Education comprehension: verbalized understanding and returned demonstration; will benefit from further review  HOME EXERCISE PROGRAM: 4 post op exercises, supine scapular series, supine flex, punches, and SL ER, SB stretch  ASSESSMENT:  CLINICAL IMPRESSION: Pt has made excellent progress and has achieved all goals established at initial evaluation. Her HEP has been updated. She continues with some tightness in left pectorals and knows to watch her posture, and to perform chest stretching exercises or self massage to the area. She will have her expander exchange on Dec 19. There are no further PT needs identified at this time, but pt knows to reach out with any questions or concerns.  OBJECTIVE IMPAIRMENTS: decreased ROM, decreased strength, impaired UE functional use, and postural dysfunction.   ACTIVITY LIMITATIONS: lifting, reach over head, and hygiene/grooming  PARTICIPATION LIMITATIONS:  able to do most if not having to use left UE  PERSONAL FACTORS: 1-2 comorbidities: Left Mastectomy, Left proximal humerus ORIF  are also affecting patient's functional outcome.   REHAB POTENTIAL: Excellent  CLINICAL DECISION MAKING: Stable/uncomplicated  EVALUATION COMPLEXITY: Low  GOALS: Goals reviewed with patient? Yes    LONG TERM GOALS: Target date: 09/03/2022    Pt will be independent with HEP for left shoulder ROM and strength Baseline:  Goal status: MET  2.  Pt will improve AROM abd by 10-20 degrees for improved reaching ability Baseline:  Goal status: MET 07/17/2022  3.  Pt will have decreased muscular tightness/tenderness by 50% Baseline:  Goal status: MET 07/23/2022  4.  Pt will have decreased complaints of left shoulder crepitus/catching by 25-50% Baseline:  Goal status: MET 07/23/2022 PLAN:  PT FREQUENCY: 1-2x/week  PT DURATION: 6  weeks  PLANNED INTERVENTIONS: Therapeutic exercises, Neuromuscular re-education, Patient/Family education, Self Care, Dry Needling, scar mobilization, and Manual therapy  PLAN FOR NEXT SESSION: DC pt to independent self management  PHYSICAL THERAPY DISCHARGE SUMMARY  Visits from Start of Care: 6  Current functional level related to goals / functional outcomes: Achieved all goals   Remaining deficits: Intermittent scapular elevation on left that pt can control actively but must continue to be aware of   Education / Equipment: Theraband, HEP   Patient agrees to discharge. Patient goals were met. Patient is being discharged due to meeting the stated rehab goals.  Claris Pong, PT 07/23/2022, 8:51 AM  Over Head Pull: Narrow and Wide Grip   Cancer Rehab [phone]   On back, knees bent, feet flat, band across thighs, elbows straight but relaxed. Pull hands apart (start). Keeping elbows straight, bring arms up and over head, hands toward floor. Keep pull steady on band. Hold momentarily. Return slowly, keeping pull steady, back to start. Then do same with a wider grip on the band (past shoulder width) Repeat _5-10__ times. Band color __yellow____   Side Pull: Double Arm   On back, knees bent, feet flat. Arms perpendicular to body, shoulder level, elbows straight but relaxed. Pull arms out to sides, elbows straight. Resistance band comes across collarbones, hands toward floor. Hold momentarily. Slowly return to starting position. Repeat _5-10__ times. Band color _yellow____   Sword   On back, knees bent, feet flat, left hand on left hip, right hand above left. Pull right arm DIAGONALLY (hip to shoulder) across chest. Bring right arm along head toward floor. Hold momentarily. Slowly return to starting position. Repeat _5-10__ times. Do with left arm. Band color _yellow_____   Shoulder Rotation: Double Arm   On back, knees bent, feet flat, elbows tucked at sides, bent 90, hands  palms up. Pull hands apart and down toward floor, keeping elbows near sides. Hold momentarily. Slowly return to starting position. Repeat _5-10__ times. Band color __yellow____

## 2022-08-09 ENCOUNTER — Other Ambulatory Visit: Payer: Self-pay

## 2022-08-09 ENCOUNTER — Encounter (HOSPITAL_BASED_OUTPATIENT_CLINIC_OR_DEPARTMENT_OTHER): Payer: Self-pay | Admitting: Plastic Surgery

## 2022-08-09 NOTE — Progress Notes (Signed)
   08/09/22 1511  PAT Phone Screen  Is the patient taking a GLP-1 receptor agonist? (S)  Yes (ozempic LD 08-11-22)  Has the patient been informed on holding medication? Yes  Do You Have Diabetes? Yes  Do You Have Hypertension? Yes  Have You Ever Been to the ER for Asthma? No  Have You Taken Oral Steroids in the Past 3 Months? No  Do you Take Phenteramine or any Other Diet Drugs? No  Recent  Lab Work, EKG, CXR? (S)  Yes (CBC/diff, CMP, Lipids, TSH, A1c 6.5, U/A in CE)  Where was this test performed? at PCP  Do you have a history of heart problems? (S)  No (05-23-21 Myoperfusion stress test EF 76%, no ischemia)  Any Recent Hospitalizations? No  Height '5\' 1"'$  (1.549 m)  Weight 64 kg  Pat Appointment Scheduled No  Reason for No Appointment Not Needed

## 2022-08-13 NOTE — Progress Notes (Signed)

## 2022-08-14 NOTE — H&P (Signed)
Subjective:     Patient ID: Leslie Potts is a 61 y.o. female.   HPI   3 mo post op left mastectomy with immediate TE/ADM reconstruction. Underwent excision closure portion vertical incision mastectomy flap for skin flap necrosis at 2.5 w post op. Plan second stage breast reconstruction   Presented following screening MMG with left breast calcifications. Diagnostic MMG showed calcifications left UO spanning 6 cm. A 0.2 cm group of calcifications involving the left UIQ noted, possibly associated with a small mass. Biopsies labeled left breast medial with high grade DCIS ER+/PR-, left breast lateral microscopic focus high grade DCIS.   MRI demonstrated a 5.2 cm region of abnormal enhancement in the left UOQ with biopsy clip. The abnormality extends to the base of the skin surface near the nipple, though no definite nipple or skin enhancement identified.  There is a separate 1 cm tubular enhancement 3 cm medial to the biopsy clip.   Given span of disease mastectomy recommended. Final pathology at least 2 cm high grade DCIS, DCIS < 1 mm from anterior margin.   Genetics negative. Sister with breast ca. She underwent bilateral mastectomies without reconstruction.   Prior 65 D desires C. Left mastectomy 702 g   Quit smoking 8.20.23   PMH included DM, HbA1c 7.4 per patient.On Ozempic.   Lives with spouse. Works Environmental health practitioner for [employer]. Also spends week at a time caring for 87 yo mother alternates with sister.   Review of Systems      Objective:   Physical Exam Cardiovascular:     Rate and Rhythm: Normal rate and regular rhythm.     Heart sounds: Normal heart sounds.  Pulmonary:     Effort: Pulmonary effort is normal.     Breath sounds: Normal breath sounds.     Chest: left chest expanded no cellulitis Left T junction 1.1 x 0.7 x 0.1 cm wound present scant drainage grade 3 ptosis right breast SN to nipple R 28 cm BW R 21 cm Nipple to  IMF R 12 cm    Assessment:    DCIS left breast S/p left SRM, prepectoral TE/ADM (Alloderm) reconstruction    Plan:     Plan left chest implant exchange right breast mastopexy. Hold tamoxifen 7-10 d prior to surgery. Hold Ozempic week prior to surgery.   Reviewed saline vs silicone, shaped vs round, textured vs smooth. Recommend HCG or capacity filled silicone implants as they may offer reduced risk visible rippling. Reviewed MRI or [place] surveillance for rupture with silicone implants. Reviewed risks BIA ALCL with textured devices, new reports SCC implant associated with breast implants. Reviewed shared risks rupture contracture need for additional surgery. Counseled implants are not permanent devices. Reviewed size in part guided by width chest, cannot assure her cup size. Patient has elected for silicone, plan smooth round. Completed Herma Carson physician patient checklist..   Over right breast plan right mastopexy. Reviewed anchor type scar, drain. Reviewed asymmetry one should expect with unilateral implant reconstruction. Discussed OP surgery, GA.    Reviewed additional risks including but not limited to risks NAC necrosis, infection, wound healing problems, seroma, hematoma, asymmetry, need to additional procedures, fat necrosis, DVT/PE, damage to adjacent structures, cardiopulmonary complications.   Rx for doxycycline given. Has pain medication at home.   Natrelle 133S FV-12-T 400 ml tissue expander placed,  350 ml total fill volume saline

## 2022-08-16 ENCOUNTER — Other Ambulatory Visit: Payer: Self-pay | Admitting: Hematology and Oncology

## 2022-08-20 ENCOUNTER — Ambulatory Visit (HOSPITAL_BASED_OUTPATIENT_CLINIC_OR_DEPARTMENT_OTHER): Payer: 59 | Admitting: Anesthesiology

## 2022-08-20 ENCOUNTER — Other Ambulatory Visit: Payer: Self-pay

## 2022-08-20 ENCOUNTER — Encounter (HOSPITAL_BASED_OUTPATIENT_CLINIC_OR_DEPARTMENT_OTHER)
Admission: RE | Disposition: A | Discharge status: Home / Self Care | Payer: Self-pay | Source: Home / Self Care | Attending: Plastic Surgery

## 2022-08-20 ENCOUNTER — Ambulatory Visit (HOSPITAL_BASED_OUTPATIENT_CLINIC_OR_DEPARTMENT_OTHER)
Admission: RE | Admit: 2022-08-20 | Discharge: 2022-08-20 | Disposition: A | Discharge status: Home / Self Care | Payer: 59 | Attending: Plastic Surgery | Admitting: Plastic Surgery

## 2022-08-20 ENCOUNTER — Encounter (HOSPITAL_BASED_OUTPATIENT_CLINIC_OR_DEPARTMENT_OTHER): Payer: Self-pay | Admitting: Plastic Surgery

## 2022-08-20 DIAGNOSIS — D649 Anemia, unspecified: Secondary | ICD-10-CM | POA: Insufficient documentation

## 2022-08-20 DIAGNOSIS — Z421 Encounter for breast reconstruction following mastectomy: Secondary | ICD-10-CM | POA: Insufficient documentation

## 2022-08-20 DIAGNOSIS — Z87891 Personal history of nicotine dependence: Secondary | ICD-10-CM | POA: Diagnosis not present

## 2022-08-20 DIAGNOSIS — Z853 Personal history of malignant neoplasm of breast: Secondary | ICD-10-CM | POA: Diagnosis not present

## 2022-08-20 DIAGNOSIS — I10 Essential (primary) hypertension: Secondary | ICD-10-CM | POA: Diagnosis not present

## 2022-08-20 DIAGNOSIS — Z9012 Acquired absence of left breast and nipple: Secondary | ICD-10-CM | POA: Diagnosis not present

## 2022-08-20 DIAGNOSIS — Z803 Family history of malignant neoplasm of breast: Secondary | ICD-10-CM | POA: Insufficient documentation

## 2022-08-20 DIAGNOSIS — Z45812 Encounter for adjustment or removal of left breast implant: Secondary | ICD-10-CM | POA: Diagnosis not present

## 2022-08-20 DIAGNOSIS — Z9010 Acquired absence of unspecified breast and nipple: Secondary | ICD-10-CM | POA: Diagnosis not present

## 2022-08-20 DIAGNOSIS — M1990 Unspecified osteoarthritis, unspecified site: Secondary | ICD-10-CM | POA: Insufficient documentation

## 2022-08-20 DIAGNOSIS — Z01818 Encounter for other preprocedural examination: Secondary | ICD-10-CM

## 2022-08-20 HISTORY — PX: REMOVAL OF TISSUE EXPANDER AND PLACEMENT OF IMPLANT: SHX6457

## 2022-08-20 HISTORY — PX: MASTOPEXY: SHX5358

## 2022-08-20 LAB — GLUCOSE, CAPILLARY
Glucose-Capillary: 152 mg/dL — ABNORMAL HIGH (ref 70–99)
Glucose-Capillary: 133 mg/dL — ABNORMAL HIGH (ref 70–99)

## 2022-08-20 SURGERY — REMOVAL, TISSUE EXPANDER, BREAST, WITH IMPLANT INSERTION
Anesthesia: General | Site: Chest | Laterality: Right

## 2022-08-20 MED ORDER — GABAPENTIN 300 MG PO CAPS
300.0000 mg | ORAL_CAPSULE | ORAL | Status: AC
  Administered 2022-08-20: 300 mg via ORAL

## 2022-08-20 MED ORDER — CHLORHEXIDINE GLUCONATE CLOTH 2 % EX PADS: 6.0000 | MEDICATED_PAD | Freq: Once | CUTANEOUS | Status: DC

## 2022-08-20 MED ORDER — BUPIVACAINE HCL 0.5 % IJ SOLN
INTRAMUSCULAR | Status: DC | PRN
  Administered 2022-08-20: 30 mL

## 2022-08-20 MED ORDER — ACETAMINOPHEN 500 MG PO TABS
1000.0000 mg | ORAL_TABLET | ORAL | Status: AC
  Administered 2022-08-20: 1000 mg via ORAL

## 2022-08-20 MED ORDER — OXYCODONE HCL 5 MG/5ML PO SOLN: 5.0000 mg | Freq: Once | ORAL | Status: AC | PRN

## 2022-08-20 MED ORDER — FENTANYL CITRATE (PF) 100 MCG/2ML IJ SOLN
INTRAMUSCULAR | Status: AC
  Filled 2022-08-20: qty 2

## 2022-08-20 MED ORDER — PHENYLEPHRINE HCL (PRESSORS) 10 MG/ML IV SOLN
INTRAVENOUS | Status: DC | PRN
  Administered 2022-08-20: 80 ug via INTRAVENOUS

## 2022-08-20 MED ORDER — PROPOFOL 10 MG/ML IV BOLUS
INTRAVENOUS | Status: DC | PRN
  Administered 2022-08-20: 120 mg via INTRAVENOUS

## 2022-08-20 MED ORDER — MIDAZOLAM HCL 5 MG/5ML IJ SOLN
INTRAMUSCULAR | Status: DC | PRN
  Administered 2022-08-20: 2 mg via INTRAVENOUS

## 2022-08-20 MED ORDER — ACETAMINOPHEN 500 MG PO TABS: 1000.0000 mg | ORAL_TABLET | Freq: Once | ORAL | Status: DC | PRN

## 2022-08-20 MED ORDER — FENTANYL CITRATE (PF) 100 MCG/2ML IJ SOLN
INTRAMUSCULAR | Status: DC | PRN
  Administered 2022-08-20 (×2): 50 ug via INTRAVENOUS

## 2022-08-20 MED ORDER — MIDAZOLAM HCL 2 MG/2ML IJ SOLN
INTRAMUSCULAR | Status: AC
  Filled 2022-08-20: qty 2

## 2022-08-20 MED ORDER — SODIUM CHLORIDE 0.9 % IV SOLN
INTRAVENOUS | Status: DC | PRN
  Administered 2022-08-20: 500 mL

## 2022-08-20 MED ORDER — OXYCODONE HCL 5 MG PO TABS
5.0000 mg | ORAL_TABLET | Freq: Once | ORAL | Status: AC | PRN
  Administered 2022-08-20: 5 mg via ORAL

## 2022-08-20 MED ORDER — DEXAMETHASONE SODIUM PHOSPHATE 4 MG/ML IJ SOLN
INTRAMUSCULAR | Status: DC | PRN
  Administered 2022-08-20: 4 mg via INTRAVENOUS

## 2022-08-20 MED ORDER — CEFAZOLIN SODIUM-DEXTROSE 2-4 GM/100ML-% IV SOLN
INTRAVENOUS | Status: AC
  Filled 2022-08-20: qty 100

## 2022-08-20 MED ORDER — LACTATED RINGERS IV SOLN: INTRAVENOUS | Status: DC

## 2022-08-20 MED ORDER — BUPIVACAINE HCL (PF) 0.5 % IJ SOLN
INTRAMUSCULAR | Status: AC
  Filled 2022-08-20: qty 30

## 2022-08-20 MED ORDER — OXYCODONE HCL 5 MG PO TABS
ORAL_TABLET | ORAL | Status: AC
  Filled 2022-08-20: qty 1

## 2022-08-20 MED ORDER — GABAPENTIN 300 MG PO CAPS
ORAL_CAPSULE | ORAL | Status: AC
  Filled 2022-08-20: qty 1

## 2022-08-20 MED ORDER — FENTANYL CITRATE (PF) 100 MCG/2ML IJ SOLN: 25.0000 ug | INTRAMUSCULAR | Status: DC | PRN

## 2022-08-20 MED ORDER — CEFAZOLIN SODIUM-DEXTROSE 2-4 GM/100ML-% IV SOLN
2.0000 g | INTRAVENOUS | Status: AC
  Administered 2022-08-20: 2 g via INTRAVENOUS

## 2022-08-20 MED ORDER — ACETAMINOPHEN 500 MG PO TABS
ORAL_TABLET | ORAL | Status: AC
  Filled 2022-08-20: qty 2

## 2022-08-20 MED ORDER — HYDROMORPHONE HCL 1 MG/ML IJ SOLN
INTRAMUSCULAR | Status: AC
  Filled 2022-08-20: qty 1

## 2022-08-20 MED ORDER — HYDROMORPHONE HCL 1 MG/ML IJ SOLN
INTRAMUSCULAR | Status: DC | PRN
  Administered 2022-08-20 (×2): .5 mg via INTRAVENOUS

## 2022-08-20 MED ORDER — LIDOCAINE 2% (20 MG/ML) 5 ML SYRINGE
INTRAMUSCULAR | Status: DC | PRN
  Administered 2022-08-20: 40 mg via INTRAVENOUS

## 2022-08-20 MED ORDER — ACETAMINOPHEN 160 MG/5ML PO SOLN: 1000.0000 mg | Freq: Once | ORAL | Status: DC | PRN

## 2022-08-20 MED ORDER — ACETAMINOPHEN 10 MG/ML IV SOLN: 1000.0000 mg | Freq: Once | INTRAVENOUS | Status: DC | PRN

## 2022-08-20 MED ORDER — SODIUM CHLORIDE 0.9 % IV SOLN
INTRAVENOUS | Status: AC
  Filled 2022-08-20: qty 10

## 2022-08-20 SURGICAL SUPPLY — 78 items
ADH SKN CLS APL DERMABOND .7 (GAUZE/BANDAGES/DRESSINGS) ×4
APL PRP STRL LF DISP 70% ISPRP (MISCELLANEOUS) ×2
BAG DECANTER FOR FLEXI CONT (MISCELLANEOUS) ×2 IMPLANT
BINDER BREAST LRG (GAUZE/BANDAGES/DRESSINGS) IMPLANT
BINDER BREAST MEDIUM (GAUZE/BANDAGES/DRESSINGS) IMPLANT
BINDER BREAST XLRG (GAUZE/BANDAGES/DRESSINGS) IMPLANT
BINDER BREAST XXLRG (GAUZE/BANDAGES/DRESSINGS) IMPLANT
BLADE SURG 10 STRL SS (BLADE) ×2 IMPLANT
BLADE SURG 15 STRL LF DISP TIS (BLADE) IMPLANT
BLADE SURG 15 STRL SS (BLADE)
BNDG GAUZE DERMACEA FLUFF 4 (GAUZE/BANDAGES/DRESSINGS) ×4 IMPLANT
BNDG GZE DERMACEA 4 6PLY (GAUZE/BANDAGES/DRESSINGS) ×4
CANISTER SUCT 1200ML W/VALVE (MISCELLANEOUS) ×2 IMPLANT
CHLORAPREP W/TINT 26 (MISCELLANEOUS) ×2 IMPLANT
COVER BACK TABLE 60X90IN (DRAPES) ×2 IMPLANT
COVER MAYO STAND STRL (DRAPES) ×2 IMPLANT
DERMABOND ADVANCED .7 DNX12 (GAUZE/BANDAGES/DRESSINGS) ×4 IMPLANT
DRAIN CHANNEL 15F RND FF W/TCR (WOUND CARE) IMPLANT
DRAIN CHANNEL 19F RND (DRAIN) IMPLANT
DRAPE TOP ARMCOVERS (MISCELLANEOUS) ×2 IMPLANT
DRAPE U-SHAPE 76X120 STRL (DRAPES) ×2 IMPLANT
DRAPE UTILITY XL STRL (DRAPES) ×2 IMPLANT
DRSG TEGADERM 2-3/8X2-3/4 SM (GAUZE/BANDAGES/DRESSINGS) IMPLANT
ELECT BLADE 4.0 EZ CLEAN MEGAD (MISCELLANEOUS) ×2
ELECT COATED BLADE 2.86 ST (ELECTRODE) ×2 IMPLANT
ELECT REM PT RETURN 9FT ADLT (ELECTROSURGICAL) ×2
ELECTRODE BLDE 4.0 EZ CLN MEGD (MISCELLANEOUS) ×2 IMPLANT
ELECTRODE REM PT RTRN 9FT ADLT (ELECTROSURGICAL) ×2 IMPLANT
EVACUATOR SILICONE 100CC (DRAIN) IMPLANT
GAUZE PAD ABD 8X10 STRL (GAUZE/BANDAGES/DRESSINGS) ×4 IMPLANT
GAUZE SPONGE 4X4 12PLY STRL (GAUZE/BANDAGES/DRESSINGS) IMPLANT
GAUZE SPONGE 4X4 12PLY STRL LF (GAUZE/BANDAGES/DRESSINGS) IMPLANT
GLOVE BIO SURGEON STRL SZ 6 (GLOVE) ×4 IMPLANT
GOWN STRL REUS W/ TWL LRG LVL3 (GOWN DISPOSABLE) ×4 IMPLANT
GOWN STRL REUS W/TWL LRG LVL3 (GOWN DISPOSABLE) ×4
IMPL BREAST P5.3XFULL RND 450 (Breast) IMPLANT
IMPL BRST P5.3XFULL RND 450CC (Breast) ×2 IMPLANT
IMPLANT BREAST GEL 450CC (Breast) ×2 IMPLANT
IV NS 1000ML (IV SOLUTION)
IV NS 1000ML BAXH (IV SOLUTION) IMPLANT
IV NS 500ML (IV SOLUTION)
IV NS 500ML BAXH (IV SOLUTION) IMPLANT
MARKER SKIN DUAL TIP RULER LAB (MISCELLANEOUS) IMPLANT
NDL FILTER BLUNT 18X1 1/2 (NEEDLE) IMPLANT
NDL HYPO 25X1 1.5 SAFETY (NEEDLE) IMPLANT
NEEDLE FILTER BLUNT 18X1 1/2 (NEEDLE) IMPLANT
NEEDLE HYPO 25X1 1.5 SAFETY (NEEDLE) IMPLANT
NS IRRIG 1000ML POUR BTL (IV SOLUTION) ×2 IMPLANT
PACK BASIN DAY SURGERY FS (CUSTOM PROCEDURE TRAY) ×2 IMPLANT
PENCIL SMOKE EVACUATOR (MISCELLANEOUS) ×2 IMPLANT
PIN SAFETY STERILE (MISCELLANEOUS) IMPLANT
SHEET MEDIUM DRAPE 40X70 STRL (DRAPES) ×4 IMPLANT
SIZER BREAST 450CC (SIZER) ×2
SIZER BREAST 485CC (SIZER) ×2
SIZER BRST 485CC (SIZER) IMPLANT
SIZER BRST P5.3X 450CC (SIZER) IMPLANT
SLEEVE SCD COMPRESS KNEE MED (STOCKING) ×2 IMPLANT
SPIKE FLUID TRANSFER (MISCELLANEOUS) IMPLANT
SPONGE T-LAP 18X18 ~~LOC~~+RFID (SPONGE) ×4 IMPLANT
STAPLER VISISTAT 35W (STAPLE) ×2 IMPLANT
STRIP CLOSURE SKIN 1/2X4 (GAUZE/BANDAGES/DRESSINGS) IMPLANT
SUT ETHILON 2 0 FS 18 (SUTURE) IMPLANT
SUT MNCRL AB 4-0 PS2 18 (SUTURE) ×2 IMPLANT
SUT PDS AB 2-0 CT2 27 (SUTURE) IMPLANT
SUT SILK 2 0 SH (SUTURE) IMPLANT
SUT VIC AB 3-0 PS1 18 (SUTURE) ×6
SUT VIC AB 3-0 PS1 18XBRD (SUTURE) ×2 IMPLANT
SUT VIC AB 3-0 SH 27 (SUTURE) ×2
SUT VIC AB 3-0 SH 27X BRD (SUTURE) ×2 IMPLANT
SUT VICRYL 4-0 PS2 18IN ABS (SUTURE) ×2 IMPLANT
SYR 20ML LL LF (SYRINGE) IMPLANT
SYR BULB IRRIG 60ML STRL (SYRINGE) ×4 IMPLANT
SYR CONTROL 10ML LL (SYRINGE) IMPLANT
TAPE MEASURE VINYL STERILE (MISCELLANEOUS) ×2 IMPLANT
TOWEL GREEN STERILE FF (TOWEL DISPOSABLE) ×4 IMPLANT
TUBE CONNECTING 20X1/4 (TUBING) ×4 IMPLANT
UNDERPAD 30X36 HEAVY ABSORB (UNDERPADS AND DIAPERS) ×4 IMPLANT
YANKAUER SUCT BULB TIP NO VENT (SUCTIONS) ×2 IMPLANT

## 2022-08-20 NOTE — Transfer of Care (Signed)
Immediate Anesthesia Transfer of Care Note  Patient: Leslie Potts  Procedure(s) Performed: REMOVAL OF TISSUE EXPANDER AND PLACEMENT OF IMPLANT (Left: Chest) MASTOPEXY (Right: Breast)  Patient Location: PACU  Anesthesia Type:General  Level of Consciousness: awake, alert , oriented, drowsy, and patient cooperative  Airway & Oxygen Therapy: Patient Spontanous Breathing and Patient connected to face mask oxygen  Post-op Assessment: Report given to RN and Post -op Vital signs reviewed and stable  Post vital signs: Reviewed and stable  Last Vitals:  Vitals Value Taken Time  BP    Temp    Pulse    Resp    SpO2      Last Pain:  Vitals:   08/20/22 0754  TempSrc: Oral  PainSc: 0-No pain         Complications: No notable events documented.

## 2022-08-20 NOTE — Anesthesia Procedure Notes (Signed)
Procedure Name: LMA Insertion Date/Time: 08/20/2022 9:17 AM  Performed by: Willa Frater, CRNAPre-anesthesia Checklist: Patient identified, Emergency Drugs available, Suction available and Patient being monitored Patient Re-evaluated:Patient Re-evaluated prior to induction Oxygen Delivery Method: Circle system utilized Preoxygenation: Pre-oxygenation with 100% oxygen Induction Type: IV induction Ventilation: Mask ventilation without difficulty LMA: LMA inserted LMA Size: 4.0 Number of attempts: 1 Airway Equipment and Method: Bite block Placement Confirmation: positive ETCO2 Tube secured with: Tape Dental Injury: Teeth and Oropharynx as per pre-operative assessment

## 2022-08-20 NOTE — Interval H&P Note (Signed)
History and Physical Interval Note:  08/20/2022 7:29 AM  Leslie Potts  has presented today for surgery, with the diagnosis of history breast cancer, acquired absence breasts.  The various methods of treatment have been discussed with the patient and family. After consideration of risks, benefits and other options for treatment, the patient has consented to  Procedure(s): REMOVAL OF TISSUE EXPANDER AND PLACEMENT OF IMPLANT (Left) MASTOPEXY (Right) as a surgical intervention.  The patient's history has been reviewed, patient examined, no change in status, stable for surgery.  I have reviewed the patient's chart and labs.  Questions were answered to the patient's satisfaction.     Arnoldo Hooker Hattye Siegfried

## 2022-08-20 NOTE — Op Note (Signed)
Operative Note   DATE OF OPERATION: 12.19.23  LOCATION: [facility]-outpatient  SURGICAL DIVISION: Plastic Surgery  PREOPERATIVE DIAGNOSES:  1. History breast cancer 2. Acquired absence breast  POSTOPERATIVE DIAGNOSES:  same  PROCEDURE:  1. Removal left chest tissue expander and placement silicone implant 2. Right breast mastopexy  SURGEON: Irene Limbo MD MBA  ASSISTANT: none  ANESTHESIA:  General.   EBL: 20 ml  COMPLICATIONS: None immediate.   INDICATIONS FOR PROCEDURE:  The patient, Leslie Potts, is a 61 y.o. female born on 1960/10/03, is here for staged breast reconstruction following left skin reduction pattern mastectomy with prepectoral tissue expander acellular dermis reconstruction.    FINDINGS: Right mastopexy 149 g. Natrelle Soft Touch smooth round Full Projection 450 ml implant placed. REF [id number]   DESCRIPTION OF PROCEDURE:  The patient was marked standing in the preoperative area to mark sternal notch, chest midline, anterior axillary line, inframammary fold. Over right breast, the location of new nipple areolar complex was marked at level of on inframammary fold on anterior surface breast by palpation. With aid of Wise pattern marker, location of new nipple areolar complex and vertical limbs (7 cm) were marked by displacement of breasts along meridian. The patient was taken to the operating room. SCDs were placed and IV antibiotics were given. The patient's operative site was prepped and draped in a sterile fashion. A time out was performed and all information was confirmed to be correct.     Incision made in left inframammary fold scar and carried through superficial fascia and capsule. Expander removed. Capsulotomies performed superiorly and medially. Sizer placed.    Over right breast, superior pedicle marked and nipple areolar complex incised with 42 mm diameter. Pedicle deepithlialized and developed to chest wall. Inferiorly based  dermoglandular pedicle preserved for use as auto augmentation. This was advanced superiorly and secured to chest wall with interrupted 2-0 PDS suture. Medial and lateral flaps developed. Lateral breast tissue excised. Breast irrigated. Hemostasis obtained. Local anesthetic infiltrated. 15 Fr JP placed percutaneously and secured with 2-0 nylon. Breast tailor tacked closed.   Patient brought to upright sitting position. A Full projection 450 ml implant selected for left chest. Patient returned to supine position.   Left chest cavity irrigated with saline solution containing Betadine, Ancef, gentamicin solution. Hemostasis ensured. Implant placed and orientation ensured. Closure completed with 3-0 vicryl to close superficial fascia and capsule over implant. 4-0 vicryl used to close dermis followed by 4-0 monocryl subcuticular.    Over right breast, closure completed with 3-0 vicryl to approximate dermis along inframammary fold and vertical limb. NAC inset with 4-0 vicryl in dermis. Skin closure completed with 4-0 monocryl subcuticular throughout. Dermabond applied to chest incisions. Dry dressing applied, followed by breast binder.  The patient was allowed to wake from anesthesia, extubated and taken to the recovery room in satisfactory condition.   SPECIMENS: right breast tissue  DRAINS: 38 Fr JP in right breast  Irene Limbo, MD [facility]  Office/ physician access line after hours [phone]

## 2022-08-20 NOTE — Discharge Instructions (Signed)
Patient had '1000mg'$  of tylenol at 0800 on 08/20/22   Post Anesthesia Home Care Instructions  Activity: Get plenty of rest for the remainder of the day. A responsible individual must stay with you for 24 hours following the procedure.  For the next 24 hours, DO NOT: -Drive a car -Paediatric nurse -Drink alcoholic beverages -Take any medication unless instructed by your physician -Make any legal decisions or sign important papers.  Meals: Start with liquid foods such as gelatin or soup. Progress to regular foods as tolerated. Avoid greasy, spicy, heavy foods. If nausea and/or vomiting occur, drink only clear liquids until the nausea and/or vomiting subsides. Call your physician if vomiting continues.  Special Instructions/Symptoms: Your throat may feel dry or sore from the anesthesia or the breathing tube placed in your throat during surgery. If this causes discomfort, gargle with warm salt water. The discomfort should disappear within 24 hours.  If you had a scopolamine patch placed behind your ear for the management of post- operative nausea and/or vomiting:  1. The medication in the patch is effective for 72 hours, after which it should be removed.  Wrap patch in a tissue and discard in the trash. Wash hands thoroughly with soap and water. 2. You may remove the patch earlier than 72 hours if you experience unpleasant side effects which may include dry mouth, dizziness or visual disturbances. 3. Avoid touching the patch. Wash your hands with soap and water after contact with the patch.    Post Anesthesia Home Care Instructions  Activity: Get plenty of rest for the remainder of the day. A responsible individual must stay with you for 24 hours following the procedure.  For the next 24 hours, DO NOT: -Drive a car -Paediatric nurse -Drink alcoholic beverages -Take any medication unless instructed by your physician -Make any legal decisions or sign important papers.  Meals: Start  with liquid foods such as gelatin or soup. Progress to regular foods as tolerated. Avoid greasy, spicy, heavy foods. If nausea and/or vomiting occur, drink only clear liquids until the nausea and/or vomiting subsides. Call your physician if vomiting continues.  Special Instructions/Symptoms: Your throat may feel dry or sore from the anesthesia or the breathing tube placed in your throat during surgery. If this causes discomfort, gargle with warm salt water. The discomfort should disappear within 24 hours.  If you had a scopolamine patch placed behind your ear for the management of post- operative nausea and/or vomiting:  1. The medication in the patch is effective for 72 hours, after which it should be removed.  Wrap patch in a tissue and discard in the trash. Wash hands thoroughly with soap and water. 2. You may remove the patch earlier than 72 hours if you experience unpleasant side effects which may include dry mouth, dizziness or visual disturbances. 3. Avoid touching the patch. Wash your hands with soap and water after contact with the patch.

## 2022-08-20 NOTE — Anesthesia Preprocedure Evaluation (Signed)
Anesthesia Evaluation  Patient identified by MRN, date of birth, ID band Patient awake    Reviewed: Allergy & Precautions, NPO status , Patient's Chart, lab work & pertinent test results  History of Anesthesia Complications Negative for: history of anesthetic complications  Airway Mallampati: III  TM Distance: >3 FB Neck ROM: Full    Dental  (+) Teeth Intact, Dental Advisory Given   Pulmonary neg shortness of breath, neg sleep apnea, neg COPD, neg recent URI, former smoker   breath sounds clear to auscultation       Cardiovascular hypertension, Pt. on medications and Pt. on home beta blockers (-) angina (-) Past MI and (-) CHF  Rhythm:Regular  2022 stress test:    The study is normal. Findings are consistent with no prior ischemia. The study is low risk.   No ST deviation was noted.   Left ventricular function is normal. Nuclear stress EF: 76 %. The left ventricular ejection fraction is hyperdynamic (>65%).   Prior study not available for comparison.   Negative, adequate stress test. Hypertensive blood pressure response to exercise.      Neuro/Psych negative neurological ROS  negative psych ROS   GI/Hepatic   Endo/Other  diabetes, Type 2Hypothyroidism  No results found for: "HGBA1C"   Renal/GU      Musculoskeletal  (+) Arthritis ,    Abdominal   Peds  Hematology  (+) Blood dyscrasia, anemia Lab Results      Component                Value               Date                      WBC                      8.6                 09/27/2020                HGB                      11.1 (L)            09/27/2020                HCT                      32.1 (L)            09/27/2020                MCV                      95.0                09/27/2020                PLT                      358                 09/27/2020              Anesthesia Other Findings   Reproductive/Obstetrics  Anesthesia Physical Anesthesia Plan  ASA: 2  Anesthesia Plan: General   Post-op Pain Management: Tylenol PO (pre-op)* and Gabapentin PO (pre-op)*   Induction: Intravenous  PONV Risk Score and Plan: 3 and Ondansetron and Dexamethasone  Airway Management Planned: LMA  Additional Equipment: None  Intra-op Plan:   Post-operative Plan: Extubation in OR  Informed Consent: I have reviewed the patients History and Physical, chart, labs and discussed the procedure including the risks, benefits and alternatives for the proposed anesthesia with the patient or authorized representative who has indicated his/her understanding and acceptance.     Dental advisory given  Plan Discussed with: CRNA  Anesthesia Plan Comments:        Anesthesia Quick Evaluation

## 2022-08-21 ENCOUNTER — Encounter (HOSPITAL_BASED_OUTPATIENT_CLINIC_OR_DEPARTMENT_OTHER): Payer: Self-pay | Admitting: Plastic Surgery

## 2022-08-21 LAB — SURGICAL PATHOLOGY

## 2022-08-21 NOTE — Anesthesia Postprocedure Evaluation (Signed)
Anesthesia Post Note  Patient: Leslie Potts  Procedure(s) Performed: REMOVAL OF TISSUE EXPANDER AND PLACEMENT OF IMPLANT (Left: Chest) MASTOPEXY (Right: Breast)     Patient location during evaluation: PACU Anesthesia Type: General Level of consciousness: awake and alert Pain management: pain level controlled Vital Signs Assessment: post-procedure vital signs reviewed and stable Respiratory status: spontaneous breathing, nonlabored ventilation and respiratory function stable Cardiovascular status: blood pressure returned to baseline and stable Postop Assessment: no apparent nausea or vomiting Anesthetic complications: no  No notable events documented.  Last Vitals:  Vitals:   08/20/22 1245 08/20/22 1335  BP: 118/75 122/71  Pulse: 75 77  Resp: 17 20  Temp:  36.6 C  SpO2: 98% 98%    Last Pain:  Vitals:   08/20/22 1335  TempSrc: Oral  PainSc: 2                  Maloree Uplinger

## 2022-09-19 ENCOUNTER — Other Ambulatory Visit (HOSPITAL_BASED_OUTPATIENT_CLINIC_OR_DEPARTMENT_OTHER): Payer: Self-pay

## 2022-09-19 MED ORDER — OZEMPIC (1 MG/DOSE) 4 MG/3ML ~~LOC~~ SOPN
1.0000 mg | PEN_INJECTOR | SUBCUTANEOUS | 2 refills | Status: AC
  Filled 2022-09-19: qty 3, 28d supply, fill #0

## 2022-09-24 NOTE — Progress Notes (Signed)
HPI:FU coronary artery disease.  Chest CT November 2021 showed aortic atherosclerosis, calcium in the circumflex.  Nuclear study September 2022 showed ejection fraction 76% and no ischemia.  Since last seen, the patient denies any dyspnea on exertion, orthopnea, PND, pedal edema, palpitations, syncope or chest pain.   Current Outpatient Medications  Medication Sig Dispense Refill   atorvastatin (LIPITOR) 40 MG tablet Take 40 mg by mouth every evening.     calcium carbonate (OSCAL) 1500 (600 Ca) MG TABS tablet Take 600 mg by mouth 2 (two) times daily.     carvedilol (COREG) 12.5 MG tablet Take 12.5 mg by mouth in the morning and at bedtime.     Cholecalciferol (VITAMIN D3) 50 MCG (2000 UT) TABS Take 4,000 Units by mouth every evening.     fenofibrate (TRICOR) 145 MG tablet Take 145 mg by mouth daily.     levothyroxine (SYNTHROID) 112 MCG tablet Take 112 mcg by mouth daily.     Lysine HCl 1000 MG TABS Take 1,000 mg by mouth daily.     metFORMIN (GLUCOPHAGE) 1000 MG tablet Take 1,000 mg by mouth 2 (two) times daily.     Multiple Vitamin (MULTIVITAMIN WITH MINERALS) TABS tablet Take 1 tablet by mouth every evening.     Naphazoline-Pheniramine (OPCON-A) 0.027-0.315 % SOLN Place 1 drop into both eyes daily. Allergy relief     Omega-3 Fatty Acids (FISH OIL PO) Take 1 capsule by mouth daily.     Semaglutide, 1 MG/DOSE, (OZEMPIC, 1 MG/DOSE,) 4 MG/3ML SOPN Inject 1 mg into the skin once a week. 3 mL 2   Semaglutide,0.25 or 0.'5MG'$ /DOS, (OZEMPIC, 0.25 OR 0.5 MG/DOSE,) 2 MG/1.5ML SOPN Inject 0.5 mg into the skin every Wednesday.     tamoxifen (NOLVADEX) 10 MG tablet TAKE 1 TABLET(10 MG) BY MOUTH DAILY 90 tablet 3   No current facility-administered medications for this visit.     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   Diabetes mellitus without complication ([facility])    Family history of adverse reaction to anesthesia     her mother hard time waking up    Family history of breast cancer 04/24/2022   Hyperlipidemia    Hypertension    Hypothyroidism     Past Surgical History:  Procedure Laterality Date   BREAST RECONSTRUCTION WITH PLACEMENT OF TISSUE EXPANDER AND ALLODERM Left 05/24/2022   Procedure: LEFT BREAST RECONSTRUCTION WITH PLACEMENT OF TISSUE EXPANDER AND ALLODERM;  Surgeon: Irene Limbo, MD;  Location: [facility];  Service: Plastics;  Laterality: Left;   KNEE ARTHROSCOPY Left    MASTOPEXY Right 08/20/2022   Procedure: MASTOPEXY;  Surgeon: Irene Limbo, MD;  Location: [facility];  Service: Plastics;  Laterality: Right;   ORIF HUMERUS FRACTURE Left 09/28/2020   Procedure: OPEN REDUCTION INTERNAL FIXATION (ORIF) PROXIMAL HUMERUS FRACTURE;  Surgeon: Verner Mould, MD;  Location: [facility];  Service: Orthopedics;  Laterality: Left;  NEEDS RNFA   REMOVAL OF TISSUE EXPANDER AND PLACEMENT OF IMPLANT Left 08/20/2022   Procedure: REMOVAL OF TISSUE EXPANDER AND PLACEMENT OF IMPLANT;  Surgeon: Irene Limbo, MD;  Location: [facility];  Service: Plastics;  Laterality: Left;   TOTAL MASTECTOMY Left 05/24/2022   Procedure: LEFT TOTAL MASTECTOMY WITH MAGTRACE INJECTION;  Surgeon: Donnie Mesa, MD;  Location: [facility];  Service: General;  Laterality: Left;  Social History   Socioeconomic History   Marital status: Married    Spouse name: Not on file   Number of children: Not on file   Years of education: Not on file   Highest education level: Not on file  Occupational History   Not on file  Tobacco Use   Smoking status: Former    Packs/day: 0.50    Years: 45.00    Total pack years: 22.50    Types: Cigarettes    Quit date: 04/21/2022    Years since quitting: 0.4   Smokeless tobacco: Never  Substance and Sexual Activity   Alcohol use: Yes    Alcohol/week: 3.0 - 4.0 standard drinks of alcohol    Types: 3 - 4 Cans of beer  per week    Comment: 2 beverages per night-wine alcohol   Drug use: Never   Sexual activity: Yes    Birth control/protection: Post-menopausal  Other Topics Concern   Not on file  Social History Narrative   Not on file   Social Determinants of Health   Financial Resource Strain: Not on file  Food Insecurity: Not on file  Transportation Needs: Not on file  Physical Activity: Not on file  Stress: Not on file  Social Connections: Not on file  Intimate Partner Violence: Not on file    Family History  Problem Relation Age of Onset   Heart attack Mother    Breast cancer Sister 66       bilateral   Colon polyps Maternal Grandmother        unknown #   Colon polyps Paternal Grandfather        unknown #   Cancer Other        MGM's 4 brothers and 1 sister; unknown type    ROS: no fevers or chills, productive cough, hemoptysis, dysphasia, odynophagia, melena, hematochezia, dysuria, hematuria, rash, seizure activity, orthopnea, PND, pedal edema, claudication. Remaining systems are negative.  Physical Exam: Well-developed well-nourished in no acute distress.  Skin is warm and dry.  HEENT is normal.  Neck is supple.  Chest is clear to auscultation with normal expansion.  Cardiovascular exam is regular rate and rhythm.  Abdominal exam nontender or distended. No masses palpated. Extremities show no edema. neuro grossly intact   A/P  1 coronary artery disease-noted to have coronary calcification on previous CT scan.  Nuclear study showed no ischemia and she denies chest pain.  Continue aspirin and statin.  2 hyperlipidemia-continue statin.  3 hypertension-blood pressure controlled.  Continue present medical regimen.  4 tobacco abuse-patient discontinued since last ov.  Kirk Ruths, MD

## 2022-10-01 ENCOUNTER — Other Ambulatory Visit (HOSPITAL_BASED_OUTPATIENT_CLINIC_OR_DEPARTMENT_OTHER): Payer: Self-pay

## 2022-10-07 ENCOUNTER — Telehealth: Payer: Self-pay | Admitting: Adult Health

## 2022-10-07 ENCOUNTER — Inpatient Hospital Stay: Payer: 59 | Attending: Hematology and Oncology | Admitting: Adult Health

## 2022-10-07 ENCOUNTER — Encounter: Payer: Self-pay | Admitting: Adult Health

## 2022-10-07 ENCOUNTER — Other Ambulatory Visit: Payer: Self-pay

## 2022-10-07 VITALS — BP 123/64 | HR 86 | Temp 97.5°F | Resp 18 | Ht 61.0 in | Wt 142.1 lb

## 2022-10-07 DIAGNOSIS — E119 Type 2 diabetes mellitus without complications: Secondary | ICD-10-CM | POA: Diagnosis not present

## 2022-10-07 DIAGNOSIS — Z87891 Personal history of nicotine dependence: Secondary | ICD-10-CM | POA: Insufficient documentation

## 2022-10-07 DIAGNOSIS — Z803 Family history of malignant neoplasm of breast: Secondary | ICD-10-CM | POA: Diagnosis not present

## 2022-10-07 DIAGNOSIS — D0512 Intraductal carcinoma in situ of left breast: Secondary | ICD-10-CM | POA: Insufficient documentation

## 2022-10-07 DIAGNOSIS — Z79810 Long term (current) use of selective estrogen receptor modulators (SERMs): Secondary | ICD-10-CM | POA: Diagnosis not present

## 2022-10-07 DIAGNOSIS — I10 Essential (primary) hypertension: Secondary | ICD-10-CM | POA: Insufficient documentation

## 2022-10-07 DIAGNOSIS — Z1231 Encounter for screening mammogram for malignant neoplasm of breast: Secondary | ICD-10-CM | POA: Diagnosis not present

## 2022-10-07 DIAGNOSIS — Z9012 Acquired absence of left breast and nipple: Secondary | ICD-10-CM | POA: Diagnosis not present

## 2022-10-07 NOTE — Progress Notes (Signed)
Spoke with joy at [facility], per Wilber Bihari NP to let Thedora Hinders know she did not put in a lung cancer screening for patient. Joy states "since the patient is being seen today, why can't she put the order in." This LPN stated per lindsey, then the results would not be sent to the PCP who has been historically ordering it. Joy faxed order over and it was received this by this LPN.

## 2022-10-07 NOTE — Telephone Encounter (Signed)
Scheduled appointment per 2/5 los. Patient is aware.

## 2022-10-07 NOTE — Progress Notes (Signed)
SURVIVORSHIP VISIT:   BRIEF ONCOLOGIC HISTORY:  Oncology History  Ductal carcinoma in situ (DCIS) of left breast  03/21/2022 Initial Diagnosis   Screening mammogram detected left breast calcifications, breast MRI revealed 5.2 cm abnormal enhancement in the left breast extending to the skin below the nipple, biopsy medial and lateral aspect: Revealed high-grade DCIS with necrosis ER 10%, PR 0%   04/24/2022 Cancer Staging   Staging form: Breast, AJCC 8th Edition - Clinical: Stage 0 (cTis (DCIS), cN0, cM0, ER+, PR-, HER2: Not Assessed) - Signed by Nicholas Lose, MD on 04/24/2022 Nuclear grade: G3   05/03/2022 Genetic Testing   Negative hereditary cancer genetic testing: no pathogenic variants detected in [facility] Custom[facility]-Cancer +RNAinsight Panel.  Report date is 05/03/2022.   The CancerNext gene panel offered by [employer] includes sequencing, rearrangement analysis, and RNA analysis for the following 36 genes:   APC, ATM, AXIN2, BARD1, BMPR1A, BRCA1, BRCA2, BRIP1, CDH1, CDK4, CDKN2A, CHEK2, DICER1, HOXB13, EPCAM, GREM1, MLH1, MSH2, MSH3, MSH6, MUTYH, NBN, NF1, NTHL1, PALB2, PMS2, POLD1, POLE, PTEN, RAD51C, RAD51D, RECQL, SMAD4, SMARCA4, STK11, and TP53.    05/24/2022 Surgery   Left mastectomy: High-grade DCIS 2 cm with necrosis and calcifications, margins negative, ER 10%, PR 0%    06/2022 -  Anti-estrogen oral therapy   Tamoxifen     INTERVAL HISTORY:  Leslie Potts to review her survivorship care plan detailing her treatment course for breast cancer, as well as monitoring long-term side effects of that treatment, education regarding health maintenance, screening, and overall wellness and health promotion.     Overall, Leslie Potts reports feeling quite well.  She is taking Tamoxifen daily with good tolerance.  She has an occasional hot flash.  She is otherwise doing well.    REVIEW OF SYSTEMS:  Review of Systems  Constitutional:  Negative for appetite change, chills, fatigue, fever and  unexpected weight change.  HENT:   Negative for hearing loss, lump/mass and trouble swallowing.   Eyes:  Negative for eye problems and icterus.  Respiratory:  Negative for chest tightness, cough and shortness of breath.   Cardiovascular:  Negative for chest pain, leg swelling and palpitations.  Gastrointestinal:  Negative for abdominal distention, abdominal pain, constipation, diarrhea, nausea and vomiting.  Endocrine: Negative for hot flashes.  Genitourinary:  Negative for difficulty urinating.   Musculoskeletal:  Negative for arthralgias.  Skin:  Negative for itching and rash.  Neurological:  Negative for dizziness, extremity weakness, headaches and numbness.  Hematological:  Negative for adenopathy. Does not bruise/bleed easily.  Psychiatric/Behavioral:  Negative for depression. The patient is not nervous/anxious.   Breast: Denies any new nodularity, masses, tenderness, nipple changes, or nipple discharge.     PAST MEDICAL/SURGICAL HISTORY:  Past Medical History:  Diagnosis Date   Arthritis    left knee, left wrist   Cancer ([place]) 05/2022   left breast DCIS   Diabetes mellitus without complication (HCC)    Family history of adverse reaction to anesthesia    her mother hard time waking up    Family history of breast cancer 04/24/2022   Hyperlipidemia    Hypertension    Hypothyroidism    Past Surgical History:  Procedure Laterality Date   BREAST RECONSTRUCTION WITH PLACEMENT OF TISSUE EXPANDER AND ALLODERM Left 05/24/2022   Procedure: LEFT BREAST RECONSTRUCTION WITH PLACEMENT OF TISSUE EXPANDER AND ALLODERM;  Surgeon: Irene Limbo, MD;  Location: [place];  Service: Plastics;  Laterality: Left;   KNEE ARTHROSCOPY Left    MASTOPEXY Right  08/20/2022   Procedure: MASTOPEXY;  Surgeon: Irene Limbo, MD;  Location: [place];  Service: Plastics;  Laterality: Right;   ORIF HUMERUS FRACTURE Left 09/28/2020   Procedure: OPEN REDUCTION INTERNAL  FIXATION (ORIF) PROXIMAL HUMERUS FRACTURE;  Surgeon: Verner Mould, MD;  Location: [place];  Service: Orthopedics;  Laterality: Left;  NEEDS RNFA   REMOVAL OF TISSUE EXPANDER AND PLACEMENT OF IMPLANT Left 08/20/2022   Procedure: REMOVAL OF TISSUE EXPANDER AND PLACEMENT OF IMPLANT;  Surgeon: Irene Limbo, MD;  Location: [place];  Service: Plastics;  Laterality: Left;   TOTAL MASTECTOMY Left 05/24/2022   Procedure: LEFT TOTAL MASTECTOMY WITH MAGTRACE INJECTION;  Surgeon: Donnie Mesa, MD;  Location: [place];  Service: General;  Laterality: Left;     ALLERGIES:  Allergies  Allergen Reactions   Meloxicam Rash     CURRENT MEDICATIONS:  Outpatient Encounter Medications as of 10/07/2022  Medication Sig   atorvastatin (LIPITOR) 40 MG tablet Take 40 mg by mouth every evening.   calcium carbonate (OSCAL) 1500 (600 Ca) MG TABS tablet Take 600 mg by mouth 2 (two) times daily.   carvedilol (COREG) 12.5 MG tablet Take 12.5 mg by mouth in the morning and at bedtime.   Cholecalciferol (VITAMIN D3) 50 MCG (2000 UT) TABS Take 4,000 Units by mouth every evening.   fenofibrate (TRICOR) 145 MG tablet Take 145 mg by mouth daily.   levothyroxine (SYNTHROID) 112 MCG tablet Take 112 mcg by mouth daily.   Lysine HCl 1000 MG TABS Take 1,000 mg by mouth daily.   metFORMIN (GLUCOPHAGE) 1000 MG tablet Take 1,000 mg by mouth 2 (two) times daily.   Multiple Vitamin (MULTIVITAMIN WITH MINERALS) TABS tablet Take 1 tablet by mouth every evening.   Naphazoline-Pheniramine (OPCON-A) 0.027-0.315 % SOLN Place 1 drop into both eyes daily. Allergy relief   Omega-3 Fatty Acids (FISH OIL PO) Take 1 capsule by mouth daily.   Semaglutide, 1 MG/DOSE, (OZEMPIC, 1 MG/DOSE,) 4 MG/3ML SOPN Inject 1 mg into the skin once a week.   Semaglutide,0.25 or 0.'5MG'$ /DOS, (OZEMPIC, 0.25 OR 0.5 MG/DOSE,) 2 MG/1.5ML SOPN Inject 0.5 mg into the skin every Wednesday.   tamoxifen (NOLVADEX) 10 MG tablet  TAKE 1 TABLET(10 MG) BY MOUTH DAILY   No facility-administered encounter medications on file as of 10/07/2022.     ONCOLOGIC FAMILY HISTORY:  Family History  Problem Relation Age of Onset   Heart attack Mother    Breast cancer Sister 58       bilateral   Colon polyps Maternal Grandmother        unknown #   Colon polyps Paternal Grandfather        unknown #   Cancer Other        MGM's 4 brothers and 1 sister; unknown type     SOCIAL HISTORY:  Social History   Socioeconomic History   Marital status: Married    Spouse name: Not on file   Number of children: Not on file   Years of education: Not on file   Highest education level: Not on file  Occupational History   Not on file  Tobacco Use   Smoking status: Former    Packs/day: 0.25    Years: 30.00    Total pack years: 7.50    Types: Cigarettes    Quit date: 04/21/2022    Years since quitting: 0.4   Smokeless tobacco: Never  Substance and Sexual Activity   Alcohol use: Yes  Alcohol/week: 3.0 - 4.0 standard drinks of alcohol    Types: 3 - 4 Cans of beer per week    Comment: 2 beverages per night-wine alcohol   Drug use: Never   Sexual activity: Yes    Birth control/protection: Post-menopausal  Other Topics Concern   Not on file  Social History Narrative   Not on file   Social Determinants of Health   Financial Resource Strain: Not on file  Food Insecurity: Not on file  Transportation Needs: Not on file  Physical Activity: Not on file  Stress: Not on file  Social Connections: Not on file  Intimate Partner Violence: Not on file     OBSERVATIONS/OBJECTIVE:  BP 123/64 (BP Location: Left Arm, Patient Position: Sitting)   Pulse 86   Temp (!) 97.5 F (36.4 C) (Tympanic)   Resp 18   Ht '5\' 1"'$  (1.549 m)   Wt 142 lb 1.6 oz (64.5 kg)   SpO2 99%   BMI 26.85 kg/m  GENERAL: Patient is a well appearing female in no acute distress HEENT:  Sclerae anicteric.  Oropharynx clear and moist. No ulcerations or evidence  of oropharyngeal candidiasis. Neck is supple.  NODES:  No cervical, supraclavicular, or axillary lymphadenopathy palpated.  BREAST EXAM: Left breast status postmastectomy and reconstruction no sign of local recurrence right breast is benign. LUNGS:  Clear to auscultation bilaterally.  No wheezes or rhonchi. HEART:  Regular rate and rhythm. No murmur appreciated. ABDOMEN:  Soft, nontender.  Positive, normoactive bowel sounds. No organomegaly palpated. MSK:  No focal spinal tenderness to palpation. Full range of motion bilaterally in the upper extremities. EXTREMITIES:  No peripheral edema.   SKIN:  Clear with no obvious rashes or skin changes. No nail dyscrasia. NEURO:  Nonfocal. Well oriented.  Appropriate affect.   LABORATORY DATA:  None for this visit.  DIAGNOSTIC IMAGING:  None for this visit.      ASSESSMENT AND PLAN:  Ms.. Potts is a pleasant 62 y.o. female with Stage 0 left breast DCIS, ER+/PR+, diagnosed in July 2023, treated with mastectomy and anti-estrogen therapy with tamoxifen beginning in October 2023.  She presents to the Survivorship Clinic for our initial meeting and routine follow-up post-completion of treatment for breast cancer.    1. Stage 0 left breast cancer:  Leslie Potts is continuing to recover from definitive treatment for breast cancer. She will follow-up with her medical oncologist, Dr. Lindi Adie in 6 months with history and physical exam per surveillance protocol.  She will continue her anti-estrogen therapy with tamoxifen. Thus far, she is tolerating the tamoxifen well, with minimal side effects. She was instructed to make Dr. Lindi Adie or myself aware if she begins to experience any worsening side effects of the medication and I could see her back in clinic to help manage those side effects, as needed.  She was recommended to continue with annual right breast screening mammograms.  Since she is taking tamoxifen we do recommend an annual pelvic exam with gynecology.   I placed a referral to Dr. Sabra Heck in GYN.  Today, a comprehensive survivorship care plan and treatment summary was reviewed with the patient today detailing her breast cancer diagnosis, treatment course, potential late/long-term effects of treatment, appropriate follow-up care with recommendations for the future, and patient education resources.  A copy of this summary, along with a letter will be sent to the patient's primary care provider via mail/fax/In Basket message after today's visit.    2. Bone health:   She was given  education on specific activities to promote bone health.  3. Cancer screening:  Due to Leslie Potts's history and her age, she should receive screening for skin cancers, colon cancer, lung cancer, and gynecologic cancers.  The information and recommendations are listed on the patient's comprehensive care plan/treatment summary and were reviewed in detail with the patient.    4. Health maintenance and wellness promotion: Leslie Potts was encouraged to consume 5-7 servings of fruits and vegetables per day. We reviewed the "Nutrition Rainbow" handout.  She was also encouraged to engage in moderate to vigorous exercise for 30 minutes per day most days of the week. She was instructed to limit her alcohol consumption and continue to abstain from tobacco use.     5. Support services/counseling: It is not uncommon for this period of the patient's cancer care trajectory to be one of many emotions and stressors.  She was given information regarding our available services and encouraged to contact me with any questions or for help enrolling in any of our support group/programs.    Follow up instructions:    -Return to cancer center in 6 months for follow-up with Dr. Lindi Adie -Mammogram due in July 2024 -She is welcome to return back to the Survivorship Clinic at any time; no additional follow-up needed at this time.  -Consider referral back to survivorship as a long-term survivor for  continued surveillance  The patient was provided an opportunity to ask questions and all were answered. The patient agreed with the plan and demonstrated an understanding of the instructions.   Total encounter time:40 minutes*in face-to-face visit time, chart review, lab review, care coordination, order entry, and documentation of the encounter time.    Wilber Bihari, NP 10/07/22 11:38 AM Medical Oncology and Hematology [facility] [place], [place] [address] Tel. [phone]    Fax. [phone]  *Total Encounter Time as defined by the Centers for Medicare and Medicaid Services includes, in addition to the face-to-face time of a patient visit (documented in the note above) non-face-to-face time: obtaining and reviewing outside history, ordering and reviewing medications, tests or procedures, care coordination (communications with other health care professionals or caregivers) and documentation in the medical record.

## 2022-10-08 ENCOUNTER — Ambulatory Visit: Payer: 59 | Attending: Cardiology | Admitting: Cardiology

## 2022-10-08 ENCOUNTER — Encounter: Payer: Self-pay | Admitting: Cardiology

## 2022-10-08 VITALS — BP 132/68 | HR 83 | Ht 61.0 in | Wt 141.0 lb

## 2022-10-08 DIAGNOSIS — I2510 Atherosclerotic heart disease of native coronary artery without angina pectoris: Secondary | ICD-10-CM

## 2022-10-08 DIAGNOSIS — E7800 Pure hypercholesterolemia, unspecified: Secondary | ICD-10-CM

## 2022-10-08 DIAGNOSIS — I10 Essential (primary) hypertension: Secondary | ICD-10-CM

## 2022-10-08 NOTE — Patient Instructions (Signed)
    Follow-Up: At [facility], you and your health needs are our priority.  As part of our continuing mission to provide you with exceptional heart care, we have created designated Provider Care Teams.  These Care Teams include your primary Cardiologist (physician) and Advanced Practice Providers (APPs -  Physician Assistants and Nurse Practitioners) who all work together to provide you with the care you need, when you need it.  We recommend signing up for the patient portal called "MyChart".  Sign up information is provided on this After Visit Summary.  MyChart is used to connect with patients for Virtual Visits (Telemedicine).  Patients are able to view lab/test results, encounter notes, upcoming appointments, etc.  Non-urgent messages can be sent to your provider as well.   To learn more about what you can do with MyChart, go to [email or web address].    Your next appointment:   12 month(s)  Provider:   Kirk Ruths MD

## 2022-11-21 ENCOUNTER — Other Ambulatory Visit: Payer: Self-pay | Admitting: Registered Nurse

## 2022-11-21 DIAGNOSIS — Z720 Tobacco use: Secondary | ICD-10-CM

## 2022-12-23 ENCOUNTER — Encounter: Payer: Self-pay | Admitting: Registered Nurse

## 2022-12-24 ENCOUNTER — Ambulatory Visit
Admission: RE | Admit: 2022-12-24 | Discharge: 2022-12-24 | Disposition: A | Discharge status: Home / Self Care | Payer: 59 | Source: Ambulatory Visit | Attending: Registered Nurse | Admitting: Registered Nurse

## 2022-12-24 DIAGNOSIS — Z720 Tobacco use: Secondary | ICD-10-CM

## 2023-02-13 ENCOUNTER — Telehealth: Payer: Self-pay | Admitting: Hematology and Oncology

## 2023-02-13 NOTE — Telephone Encounter (Signed)
Returning call per voicemail. Patient is aware of rescheduled appointment time/date.

## 2023-02-18 ENCOUNTER — Encounter: Payer: Self-pay | Admitting: Hematology and Oncology

## 2023-03-17 ENCOUNTER — Ambulatory Visit
Admission: RE | Admit: 2023-03-17 | Discharge: 2023-03-17 | Disposition: A | Discharge status: Home / Self Care | Payer: 59 | Source: Ambulatory Visit | Attending: Adult Health | Admitting: Adult Health

## 2023-03-17 DIAGNOSIS — Z1231 Encounter for screening mammogram for malignant neoplasm of breast: Secondary | ICD-10-CM

## 2023-03-20 ENCOUNTER — Other Ambulatory Visit: Payer: Self-pay | Admitting: Adult Health

## 2023-03-20 DIAGNOSIS — R928 Other abnormal and inconclusive findings on diagnostic imaging of breast: Secondary | ICD-10-CM

## 2023-03-26 ENCOUNTER — Ambulatory Visit: Admission: RE | Admit: 2023-03-26 | Payer: 59 | Source: Ambulatory Visit

## 2023-03-26 ENCOUNTER — Ambulatory Visit
Admission: RE | Admit: 2023-03-26 | Discharge: 2023-03-26 | Disposition: A | Discharge status: Home / Self Care | Payer: 59 | Source: Ambulatory Visit | Attending: Adult Health | Admitting: Adult Health

## 2023-03-26 DIAGNOSIS — R928 Other abnormal and inconclusive findings on diagnostic imaging of breast: Secondary | ICD-10-CM

## 2023-04-07 NOTE — Progress Notes (Signed)
Patient Care Team: Fatima Sanger, FNP as PCP - General (Internal Medicine) Jens Som Madolyn Frieze, MD as PCP - Cardiology (Cardiology) Serena Croissant, MD as Consulting Physician (Hematology and Oncology) Glenna Fellows, MD as Consulting Physician (Plastic Surgery) Manus Rudd, MD as Consulting Physician (General Surgery)  DIAGNOSIS: No diagnosis found.  SUMMARY OF ONCOLOGIC HISTORY: Oncology History  Ductal carcinoma in situ (DCIS) of left breast  03/21/2022 Initial Diagnosis   Screening mammogram detected left breast calcifications, breast MRI revealed 5.2 cm abnormal enhancement in the left breast extending to the skin below the nipple, biopsy medial and lateral aspect: Revealed high-grade DCIS with necrosis ER 10%, PR 0%   04/24/2022 Cancer Staging   Staging form: Breast, AJCC 8th Edition - Clinical: Stage 0 (cTis (DCIS), cN0, cM0, ER+, PR-, HER2: Not Assessed) - Signed by Serena Croissant, MD on 04/24/2022 Nuclear grade: G3   05/03/2022 Genetic Testing   Negative hereditary cancer genetic testing: no pathogenic variants detected in [facility] CustomNext-Cancer +RNAinsight Panel.  Report date is 05/03/2022.   The CancerNext gene panel offered by [employer] includes sequencing, rearrangement analysis, and RNA analysis for the following 36 genes:   APC, ATM, AXIN2, BARD1, BMPR1A, BRCA1, BRCA2, BRIP1, CDH1, CDK4, CDKN2A, CHEK2, DICER1, HOXB13, EPCAM, GREM1, MLH1, MSH2, MSH3, MSH6, MUTYH, NBN, NF1, NTHL1, PALB2, PMS2, POLD1, POLE, PTEN, RAD51C, RAD51D, RECQL, SMAD4, SMARCA4, STK11, and TP53.    05/24/2022 Surgery   Left mastectomy: High-grade DCIS 2 cm with necrosis and calcifications, margins negative, ER 10%, PR 0%    06/2022 -  Anti-estrogen oral therapy   Tamoxifen     CHIEF COMPLIANT: Follow-up Tamoxifen  INTERVAL HISTORY: Leslie Potts is a  62 y.o. female is here because of recent diagnosis of left breast DCIS. She presents to the clinic for a follow-up    ALLERGIES:   is allergic to meloxicam.  MEDICATIONS:  Current Outpatient Medications  Medication Sig Dispense Refill   atorvastatin (LIPITOR) 40 MG tablet Take 40 mg by mouth every evening.     calcium carbonate (OSCAL) 1500 (600 Ca) MG TABS tablet Take 600 mg by mouth 2 (two) times daily.     carvedilol (COREG) 12.5 MG tablet Take 12.5 mg by mouth in the morning and at bedtime.     Cholecalciferol (VITAMIN D3) 50 MCG (2000 UT) TABS Take 4,000 Units by mouth every evening.     fenofibrate (TRICOR) 145 MG tablet Take 145 mg by mouth daily.     levothyroxine (SYNTHROID) 112 MCG tablet Take 112 mcg by mouth daily.     Lysine HCl 1000 MG TABS Take 1,000 mg by mouth daily.     metFORMIN (GLUCOPHAGE) 1000 MG tablet Take 1,000 mg by mouth 2 (two) times daily.     Multiple Vitamin (MULTIVITAMIN WITH MINERALS) TABS tablet Take 1 tablet by mouth every evening.     Naphazoline-Pheniramine (OPCON-A) 0.027-0.315 % SOLN Place 1 drop into both eyes daily. Allergy relief     Omega-3 Fatty Acids (FISH OIL PO) Take 1 capsule by mouth daily.     Semaglutide, 1 MG/DOSE, (OZEMPIC, 1 MG/DOSE,) 4 MG/3ML SOPN Inject 1 mg into the skin once a week. 3 mL 2   Semaglutide,0.25 or 0.5MG /DOS, (OZEMPIC, 0.25 OR 0.5 MG/DOSE,) 2 MG/1.5ML SOPN Inject 0.5 mg into the skin every Wednesday.     tamoxifen (NOLVADEX) 10 MG tablet TAKE 1 TABLET(10 MG) BY MOUTH DAILY 90 tablet 3   No current facility-administered medications for this visit.    PHYSICAL EXAMINATION:  ECOG PERFORMANCE STATUS: {CHL ONC ECOG PS:[phone]}  There were no vitals filed for this visit. There were no vitals filed for this visit.  BREAST:*** No palpable masses or nodules in either right or left breasts. No palpable axillary supraclavicular or infraclavicular adenopathy no breast tenderness or nipple discharge. (exam performed in the presence of a chaperone)  LABORATORY DATA:  I have reviewed the data as listed    Latest Ref Rng & Units 05/15/2022   10:30 AM  09/27/2020   10:30 AM  CMP  Glucose 70 - 99 mg/dL 865  784   BUN 6 - 20 mg/dL 11  10   Creatinine 6.96 - 1.00 mg/dL 2.95  2.84   Sodium 132 - 145 mmol/L 136  138   Potassium 3.5 - 5.1 mmol/L 4.5  4.0   Chloride 98 - 111 mmol/L 107  104   CO2 22 - 32 mmol/L 22  23   Calcium 8.9 - 10.3 mg/dL 9.6  8.8   Total Protein 6.5 - 8.1 g/dL  6.3   Total Bilirubin 0.3 - 1.2 mg/dL  0.4   Alkaline Phos 38 - 126 U/L  59   AST 15 - 41 U/L  27   ALT 0 - 44 U/L  27     Lab Results  Component Value Date   WBC 8.6 09/27/2020   HGB 11.1 (L) 09/27/2020   HCT 32.1 (L) 09/27/2020   MCV 95.0 09/27/2020   PLT 358 09/27/2020    ASSESSMENT & PLAN:  No problem-specific Assessment & Plan notes found for this encounter.    No orders of the defined types were placed in this encounter.  The patient has a good understanding of the overall plan. she agrees with it. she will call with any problems that may develop before the next visit here. Total time spent: 30 mins including face to face time and time spent for planning, charting and co-ordination of care   Sherlyn Lick, CMA 04/07/23    I Janan Ridge am acting as a Neurosurgeon for [employer]  ***

## 2023-04-08 ENCOUNTER — Other Ambulatory Visit: Payer: Self-pay

## 2023-04-08 ENCOUNTER — Inpatient Hospital Stay: Payer: 59 | Attending: Hematology and Oncology | Admitting: Hematology and Oncology

## 2023-04-08 VITALS — BP 131/73 | HR 84 | Temp 97.5°F | Resp 18 | Ht 61.0 in | Wt 145.9 lb

## 2023-04-08 DIAGNOSIS — Z79810 Long term (current) use of selective estrogen receptor modulators (SERMs): Secondary | ICD-10-CM | POA: Diagnosis not present

## 2023-04-08 DIAGNOSIS — Z9012 Acquired absence of left breast and nipple: Secondary | ICD-10-CM | POA: Insufficient documentation

## 2023-04-08 DIAGNOSIS — D0512 Intraductal carcinoma in situ of left breast: Secondary | ICD-10-CM | POA: Insufficient documentation

## 2023-04-08 NOTE — Assessment & Plan Note (Addendum)
03/29/2022: Screening mammogram detected left breast calcifications, breast MRI revealed 5.2 cm abnormal enhancement in the left breast extending to the skin below the nipple, biopsy medial and lateral aspect: Revealed high-grade DCIS with necrosis ER 10%, PR 0%  05/24/2022: Left mastectomy: High-grade DCIS 2 cm with necrosis and calcifications, margins negative, ER 10%, PR 0%   Currently undergoing breast reconstruction process with Dr. Leta Baptist.   Current treatment: Tamoxifen 10 mg daily started 04/24/2022 Tamoxifen toxicities: Denies any hot flashes arthralgias or myalgias.  Breast cancer surveillance: Breast exam 04/08/2023: Benign Mammogram 03/19/2023: Asymmetry right breast, additional mammogram and ultrasound 03/26/2023: Benign breast density category B  Return to clinic in 6 months for follow-up and after that we can see her once a year.

## 2023-04-14 ENCOUNTER — Ambulatory Visit: Payer: 59 | Admitting: Hematology and Oncology

## 2023-06-06 ENCOUNTER — Telehealth: Payer: Self-pay | Admitting: Hematology and Oncology

## 2023-06-06 NOTE — Telephone Encounter (Signed)
Per Staff Message on 06/06/23; I called patient and scheduled appointment. Patient is aware of the date and time of appointment.

## 2023-08-19 ENCOUNTER — Other Ambulatory Visit: Payer: Self-pay | Admitting: Hematology and Oncology

## 2023-10-23 NOTE — Progress Notes (Signed)
 HPI: FU coronary artery disease.  Chest CT November 2021 showed aortic atherosclerosis, calcium in the circumflex.  Nuclear study September 2022 showed ejection fraction 76% and no ischemia.  Since last seen, the patient denies any dyspnea on exertion, orthopnea, PND, pedal edema, palpitations, syncope or chest pain.   Current Outpatient Medications  Medication Sig Dispense Refill   atorvastatin (LIPITOR) 40 MG tablet Take 40 mg by mouth every evening.     calcium carbonate (OSCAL) 1500 (600 Ca) MG TABS tablet Take 600 mg by mouth 2 (two) times daily.     carvedilol (COREG) 12.5 MG tablet Take 12.5 mg by mouth in the morning and at bedtime.     Cholecalciferol (VITAMIN D3) 50 MCG (2000 UT) TABS Take 4,000 Units by mouth every evening.     fenofibrate (TRICOR) 145 MG tablet Take 145 mg by mouth daily.     levothyroxine (SYNTHROID) 112 MCG tablet Take 112 mcg by mouth daily.     Lysine HCl 1000 MG TABS Take 1,000 mg by mouth daily.     metFORMIN (GLUCOPHAGE) 1000 MG tablet Take 1,000 mg by mouth 2 (two) times daily.     Multiple Vitamin (MULTIVITAMIN WITH MINERALS) TABS tablet Take 1 tablet by mouth every evening.     Naphazoline-Pheniramine (OPCON-A) 0.027-0.315 % SOLN Place 1 drop into both eyes daily. Allergy relief     Omega-3 Fatty Acids (FISH OIL PO) Take 1 capsule by mouth daily.     Semaglutide, 1 MG/DOSE, (OZEMPIC, 1 MG/DOSE,) 4 MG/3ML SOPN Inject 1 mg into the skin once a week. 3 mL 2   tamoxifen (NOLVADEX) 10 MG tablet TAKE 1 TABLET(10 MG) BY MOUTH DAILY 90 tablet 3   No current facility-administered medications for this visit.     Past Medical History:  Diagnosis Date   Arthritis    left knee, left wrist   Cancer (HCC) 05/2022   left breast DCIS   Diabetes mellitus without complication (HCC)    Family history of adverse reaction to anesthesia    her mother hard time waking up    Family history of breast cancer 04/24/2022   Hyperlipidemia    Hypertension     Hypothyroidism     Past Surgical History:  Procedure Laterality Date   BREAST RECONSTRUCTION WITH PLACEMENT OF TISSUE EXPANDER AND ALLODERM Left 05/24/2022   Procedure: LEFT BREAST RECONSTRUCTION WITH PLACEMENT OF TISSUE EXPANDER AND ALLODERM;  Surgeon: Glenna Fellows, MD;  Location: [facility];  Service: Plastics;  Laterality: Left;   KNEE ARTHROSCOPY Left    MASTOPEXY Right 08/20/2022   Procedure: MASTOPEXY;  Surgeon: Glenna Fellows, MD;  Location: [facility];  Service: Plastics;  Laterality: Right;   ORIF HUMERUS FRACTURE Left 09/28/2020   Procedure: OPEN REDUCTION INTERNAL FIXATION (ORIF) PROXIMAL HUMERUS FRACTURE;  Surgeon: Ernest Mallick, MD;  Location: [facility];  Service: Orthopedics;  Laterality: Left;  NEEDS RNFA   REMOVAL OF TISSUE EXPANDER AND PLACEMENT OF IMPLANT Left 08/20/2022   Procedure: REMOVAL OF TISSUE EXPANDER AND PLACEMENT OF IMPLANT;  Surgeon: Glenna Fellows, MD;  Location: [facility];  Service: Plastics;  Laterality: Left;   TOTAL MASTECTOMY Left 05/24/2022   Procedure: LEFT TOTAL MASTECTOMY WITH MAGTRACE INJECTION;  Surgeon: Manus Rudd, MD;  Location: [facility];  Service: General;  Laterality: Left;    Social History   Socioeconomic History   Marital status: Married    Spouse name: Not on file  Number of children: Not on file   Years of education: Not on file   Highest education level: Not on file  Occupational History   Not on file  Tobacco Use   Smoking status: Former    Current packs/day: 0.00    Average packs/day: 0.5 packs/day for 45.0 years (22.5 ttl pk-yrs)    Types: Cigarettes    Start date: 04/21/1977    Quit date: 04/21/2022    Years since quitting: 1.5   Smokeless tobacco: Never  Vaping Use   Vaping status: Never Used  Substance and Sexual Activity   Alcohol use: Yes    Alcohol/week: 3.0 - 4.0 standard drinks of alcohol    Types: 3 - 4 Cans of beer per week     Comment: 2   Drug use: Never   Sexual activity: Not Currently    Birth control/protection: Post-menopausal  Other Topics Concern   Not on file  Social History Narrative   Not on file   Social Drivers of Health   Financial Resource Strain: Not on file  Food Insecurity: Not on file  Transportation Needs: Not on file  Physical Activity: Not on file  Stress: Not on file  Social Connections: Not on file  Intimate Partner Violence: Not on file    Family History  Problem Relation Age of Onset   Colon polyps Paternal Grandfather        unknown #   Colon polyps Maternal Grandmother        unknown #   Heart disease Father    Hypertension Mother    Stroke Mother    Heart attack Mother    Breast cancer Sister 48       bilateral   Cancer Other        MGM's 4 brothers and 1 sister; unknown type    ROS: no fevers or chills, productive cough, hemoptysis, dysphasia, odynophagia, melena, hematochezia, dysuria, hematuria, rash, seizure activity, orthopnea, PND, pedal edema, claudication. Remaining systems are negative.  Physical Exam: Well-developed well-nourished in no acute distress.  Skin is warm and dry.  HEENT is normal.  Neck is supple.  Chest is clear to auscultation with normal expansion.  Cardiovascular exam is regular rate and rhythm.  Abdominal exam nontender or distended. No masses palpated. Extremities show no edema. neuro grossly intact  EKG Interpretation Date/Time:  Monday November 03 2023 16:27:42 EST Ventricular Rate:  84 PR Interval:  174 QRS Duration:  84 QT Interval:  368 QTC Calculation: 434 R Axis:   67  Text Interpretation: Normal sinus rhythm Cannot rule out Anterior infarct (cited on or before 27-Sep-2020) When compared with ECG of 15-May-2022 07:57, No significant change was found Confirmed by Olga Millers ([id number]) on 11/03/2023 4:28:54 PM     A/P  1 coronary calcification-patient denies chest pain.  Previous nuclear study showed no ischemia.  Plan  medical therapy.  Continue aspirin and statin.  2 hypertension-patient's blood pressure is controlled.  Continue present medications.  3 hyperlipidemia-continue statin.   Olga Millers, MD

## 2023-10-28 ENCOUNTER — Encounter (HOSPITAL_BASED_OUTPATIENT_CLINIC_OR_DEPARTMENT_OTHER): Payer: Self-pay | Admitting: Obstetrics & Gynecology

## 2023-10-28 ENCOUNTER — Ambulatory Visit (HOSPITAL_BASED_OUTPATIENT_CLINIC_OR_DEPARTMENT_OTHER): Payer: 59 | Admitting: Obstetrics & Gynecology

## 2023-10-28 ENCOUNTER — Other Ambulatory Visit (HOSPITAL_COMMUNITY)
Admission: RE | Admit: 2023-10-28 | Discharge: 2023-10-28 | Disposition: A | Discharge status: Home / Self Care | Payer: 59 | Source: Ambulatory Visit | Attending: Obstetrics & Gynecology | Admitting: Obstetrics & Gynecology

## 2023-10-28 VITALS — BP 131/79 | HR 88 | Ht 60.0 in | Wt 149.0 lb

## 2023-10-28 DIAGNOSIS — Z01419 Encounter for gynecological examination (general) (routine) without abnormal findings: Secondary | ICD-10-CM

## 2023-10-28 DIAGNOSIS — Z13820 Encounter for screening for osteoporosis: Secondary | ICD-10-CM

## 2023-10-28 DIAGNOSIS — Z124 Encounter for screening for malignant neoplasm of cervix: Secondary | ICD-10-CM

## 2023-10-28 DIAGNOSIS — Z780 Asymptomatic menopausal state: Secondary | ICD-10-CM | POA: Diagnosis not present

## 2023-10-28 DIAGNOSIS — D0512 Intraductal carcinoma in situ of left breast: Secondary | ICD-10-CM | POA: Diagnosis not present

## 2023-10-28 NOTE — Progress Notes (Unsigned)
   ANNUAL EXAM Patient name: Leslie Potts MRN [id number]  Date of birth: [birth year] Chief Complaint:   New patient exam  History of Present Illness:   Leslie Potts is a 63 y.o. G0P0000 Caucasian female being seen today for a routine annual exam.    No LMP recorded. Patient is postmenopausal.   The pregnancy intention screening data noted above was reviewed. Potential methods of contraception were discussed. The patient elected to proceed with No data recorded.   Last pap . Normal per pt.  H/O abnormal pap: yes remote hx and follow up was normal.   Last mammogram: 03/17/23. Results were: normal. Family h/o breast cancer: yes -pts sister Last colonoscopy: 07/2023. Results were: normal. Family h/o colorectal cancer: no     10/28/2023    3:44 PM  Depression screen PHQ 2/9  Decreased Interest 0  Down, Depressed, Hopeless 0  PHQ - 2 Score 0     Review of Systems:   Pertinent items are noted in HPI Denies any headaches, blurred vision, fatigue, shortness of breath, chest pain, abdominal pain, abnormal vaginal discharge/itching/odor/irritation, problems with periods, bowel movements, urination, or intercourse unless otherwise stated above. Pertinent History Reviewed:  Reviewed past medical,surgical, social and family history.  Reviewed problem list, medications and allergies. Physical Assessment:   Vitals:   10/28/23 1539  BP: 131/79  Pulse: 88  Weight: 149 lb (67.6 kg)  Height: 5' (1.524 m)  Body mass index is 29.1 kg/m.        Physical Examination:   General appearance - well appearing, and in no distress  Mental status - alert, oriented to person, place, and time  Psych:  She has a normal mood and affect  Skin - warm and dry, normal color, no suspicious lesions noted  Chest - effort normal, all lung fields clear to auscultation bilaterally  Heart - normal rate and regular rhythm  Neck:  midline trachea, no thyromegaly or nodules  Breasts - breasts appear  normal, no suspicious masses, no skin or nipple changes or  axillary nodes  Abdomen - soft, nontender, nondistended, no masses or organomegaly  Pelvic - VULVA: normal appearing vulva with no masses, tenderness or lesions   VAGINA: normal appearing vagina with normal color and discharge, no lesions   CERVIX: normal appearing cervix without discharge or lesions, no CMT  Thin prep pap is obtained today.  HPV testing obtained.    UTERUS: uterus is felt to be normal size, shape, consistency and nontender   ADNEXA: No adnexal masses or tenderness noted.  Rectal - normal rectal, good sphincter tone, no masses felt. Hemoccult: ***  Extremities:  No swelling or varicosities noted  Chaperone present for exam  No results found for this or any previous visit (from the past 24 hours).  Assessment & Plan:    No orders of the defined types were placed in this encounter.   Meds: No orders of the defined types were placed in this encounter.   Follow-up: No follow-ups on file.  Harrie Jeans, RN 10/28/2023 4:01 PM

## 2023-10-30 LAB — CYTOLOGY - PAP
Comment: NEGATIVE
Diagnosis: NEGATIVE
High risk HPV: NEGATIVE

## 2023-10-31 ENCOUNTER — Encounter (HOSPITAL_BASED_OUTPATIENT_CLINIC_OR_DEPARTMENT_OTHER): Payer: Self-pay | Admitting: Obstetrics & Gynecology

## 2023-11-03 ENCOUNTER — Encounter: Payer: Self-pay | Admitting: Cardiology

## 2023-11-03 ENCOUNTER — Ambulatory Visit: Payer: 59 | Attending: Cardiology | Admitting: Cardiology

## 2023-11-03 VITALS — BP 134/70 | HR 84 | Ht 61.0 in | Wt 148.2 lb

## 2023-11-03 DIAGNOSIS — I10 Essential (primary) hypertension: Secondary | ICD-10-CM

## 2023-11-03 DIAGNOSIS — I2510 Atherosclerotic heart disease of native coronary artery without angina pectoris: Secondary | ICD-10-CM

## 2023-11-03 DIAGNOSIS — E7800 Pure hypercholesterolemia, unspecified: Secondary | ICD-10-CM | POA: Diagnosis not present

## 2023-11-03 NOTE — Patient Instructions (Signed)
   Follow-Up: At [facility], you and your health needs are our priority.  As part of our continuing mission to provide you with exceptional heart care, we have created designated Provider Care Teams.  These Care Teams include your primary Cardiologist (physician) and Advanced Practice Providers (APPs -  Physician Assistants and Nurse Practitioners) who all work together to provide you with the care you need, when you need it.  We recommend signing up for the patient portal called "MyChart".  Sign up information is provided on this After Visit Summary.  MyChart is used to connect with patients for Virtual Visits (Telemedicine).  Patients are able to view lab/test results, encounter notes, upcoming appointments, etc.  Non-urgent messages can be sent to your provider as well.   To learn more about what you can do with MyChart, go to [email or web address].    Your next appointment:   12 month(s)  Provider:   Olga Millers, MD

## 2023-11-19 ENCOUNTER — Other Ambulatory Visit: Payer: Self-pay | Admitting: Obstetrics & Gynecology

## 2023-11-19 DIAGNOSIS — Z1231 Encounter for screening mammogram for malignant neoplasm of breast: Secondary | ICD-10-CM

## 2024-01-13 ENCOUNTER — Other Ambulatory Visit: Payer: Self-pay | Admitting: Registered Nurse

## 2024-01-13 DIAGNOSIS — R911 Solitary pulmonary nodule: Secondary | ICD-10-CM

## 2024-02-03 ENCOUNTER — Ambulatory Visit
Admission: RE | Admit: 2024-02-03 | Discharge: 2024-02-03 | Disposition: A | Discharge status: Home / Self Care | Source: Ambulatory Visit | Attending: Registered Nurse | Admitting: Registered Nurse

## 2024-02-03 DIAGNOSIS — R911 Solitary pulmonary nodule: Secondary | ICD-10-CM

## 2024-02-18 LAB — LAB REPORT - SCANNED
A1c: 7.1
EGFR: 100

## 2024-02-25 ENCOUNTER — Encounter: Payer: Self-pay | Admitting: Registered Nurse

## 2024-03-17 ENCOUNTER — Ambulatory Visit
Admission: RE | Admit: 2024-03-17 | Discharge: 2024-03-17 | Disposition: A | Discharge status: Home / Self Care | Source: Ambulatory Visit | Attending: Obstetrics & Gynecology | Admitting: Obstetrics & Gynecology

## 2024-03-17 DIAGNOSIS — Z1231 Encounter for screening mammogram for malignant neoplasm of breast: Secondary | ICD-10-CM

## 2024-04-05 ENCOUNTER — Ambulatory Visit: Payer: 59 | Admitting: Hematology and Oncology

## 2024-04-07 NOTE — Assessment & Plan Note (Signed)
 03/29/2022: Screening mammogram detected left breast calcifications, breast MRI revealed 5.2 cm abnormal enhancement in the left breast extending to the skin below the nipple, biopsy medial and lateral aspect: Revealed high-grade DCIS with necrosis ER 10%, PR 0%  05/24/2022: Left mastectomy: High-grade DCIS 2 cm with necrosis and calcifications, margins negative, ER 10%, PR 0%   Currently undergoing breast reconstruction process with Dr. Arelia.   Current treatment: Tamoxifen  10 mg daily started 04/24/2022 Tamoxifen  toxicities: Denies any hot flashes arthralgias or myalgias. Encouraged her to stay more active and exercise regularly. Breast cancer surveillance: Breast exam 04/08/2024: Benign Mammogram 03/22/2024: Asymmetry right breast, additional mammogram and ultrasound: Benign breast density category B   Return to clinic in 1 year for follow-up

## 2024-04-08 ENCOUNTER — Inpatient Hospital Stay: Attending: Hematology and Oncology | Admitting: Hematology and Oncology

## 2024-04-08 VITALS — BP 125/66 | HR 86 | Temp 98.2°F | Resp 16 | Ht 61.0 in | Wt 147.4 lb

## 2024-04-08 DIAGNOSIS — D0512 Intraductal carcinoma in situ of left breast: Secondary | ICD-10-CM | POA: Diagnosis not present

## 2024-04-08 DIAGNOSIS — Z9012 Acquired absence of left breast and nipple: Secondary | ICD-10-CM | POA: Insufficient documentation

## 2024-04-08 DIAGNOSIS — Z1722 Progesterone receptor negative status: Secondary | ICD-10-CM | POA: Insufficient documentation

## 2024-04-08 DIAGNOSIS — R252 Cramp and spasm: Secondary | ICD-10-CM | POA: Diagnosis not present

## 2024-04-08 DIAGNOSIS — Z79810 Long term (current) use of selective estrogen receptor modulators (SERMs): Secondary | ICD-10-CM | POA: Diagnosis not present

## 2024-04-08 DIAGNOSIS — R232 Flushing: Secondary | ICD-10-CM | POA: Diagnosis not present

## 2024-04-08 DIAGNOSIS — Z170 Estrogen receptor positive status [ER+]: Secondary | ICD-10-CM | POA: Insufficient documentation

## 2024-04-08 MED ORDER — TAMOXIFEN CITRATE 10 MG PO TABS: 10.0000 mg | ORAL_TABLET | Freq: Every day | ORAL | 3 refills | Status: DC

## 2024-04-08 NOTE — Progress Notes (Signed)
 Patient Care Team: Royden Ronal Czar, FNP as PCP - General (Internal Medicine) Pietro Redell RAMAN, MD as PCP - Cardiology (Cardiology) Odean Potts, MD as Consulting Physician (Hematology and Oncology) Arelia Filippo, MD as Consulting Physician (Plastic Surgery) Belinda Cough, MD as Consulting Physician (General Surgery)  DIAGNOSIS:  Encounter Diagnosis  Name Primary?   Ductal carcinoma in situ (DCIS) of left breast Yes    SUMMARY OF ONCOLOGIC HISTORY: Oncology History  Ductal carcinoma in situ (DCIS) of left breast  03/21/2022 Initial Diagnosis   Screening mammogram detected left breast calcifications, breast MRI revealed 5.2 cm abnormal enhancement in the left breast extending to the skin below the nipple, biopsy medial and lateral aspect: Revealed high-grade DCIS with necrosis ER 10%, PR 0%   04/24/2022 Cancer Staging   Staging form: Breast, AJCC 8th Edition - Clinical: Stage 0 (cTis (DCIS), cN0, cM0, ER+, PR-, HER2: Not Assessed) - Signed by Odean Potts, MD on 04/24/2022 Nuclear grade: G3   05/03/2022 Genetic Testing   Negative hereditary cancer genetic testing: no pathogenic variants detected in [employer] [facility]Next-Cancer +RNAinsight Panel.  Report date is 05/03/2022.   The CancerNext gene panel offered by [employer] includes sequencing, rearrangement analysis, and RNA analysis for the following 36 genes:   APC, ATM, AXIN2, BARD1, BMPR1A, BRCA1, BRCA2, BRIP1, CDH1, CDK4, CDKN2A, CHEK2, DICER1, HOXB13, EPCAM, GREM1, MLH1, MSH2, MSH3, MSH6, MUTYH, NBN, NF1, NTHL1, PALB2, PMS2, POLD1, POLE, PTEN, RAD51C, RAD51D, RECQL, SMAD4, SMARCA4, STK11, and TP53.    05/24/2022 Surgery   Left mastectomy: High-grade DCIS 2 cm with necrosis and calcifications, margins negative, ER 10%, PR 0%    06/2022 -  Anti-estrogen oral therapy   Tamoxifen      CHIEF COMPLIANT: Follow-up on tamoxifen  therapy  HISTORY OF PRESENT ILLNESS:   History of Present Illness Leslie Potts is a  63 year old female who presents for a follow-up visit two years post-surgery for breast cancer.  She is on tamoxifen  therapy and experiences mild hot flashes and significant leg cramping, which she attributes to the medication. She manages leg cramps with over-the-counter magnesium glycinate, 250 mg, as needed. Her last mammogram on July 21st was normal, and annual CT scans of the chest for lung cancer screening have been normal. She has resolved previous issues with [facility] regarding her tamoxifen  prescription and has enough refills for another year.     ALLERGIES:  is allergic to meloxicam.  MEDICATIONS:  Current Outpatient Medications  Medication Sig Dispense Refill   atorvastatin  (LIPITOR) 40 MG tablet Take 40 mg by mouth every evening.     calcium  carbonate (OSCAL) 1500 (600 Ca) MG TABS tablet Take 600 mg by mouth 2 (two) times daily.     carvedilol  (COREG ) 12.5 MG tablet Take 12.5 mg by mouth in the morning and at bedtime.     Cholecalciferol (VITAMIN D3) 50 MCG (2000 UT) TABS Take 4,000 Units by mouth every evening.     fenofibrate (TRICOR) 145 MG tablet Take 145 mg by mouth daily.     levothyroxine  (SYNTHROID ) 112 MCG tablet Take 112 mcg by mouth daily.     Lysine HCl 1000 MG TABS Take 1,000 mg by mouth daily.     metFORMIN  (GLUCOPHAGE ) 1000 MG tablet Take 1,000 mg by mouth 2 (two) times daily.     Multiple Vitamin (MULTIVITAMIN WITH MINERALS) TABS tablet Take 1 tablet by mouth every evening.     Naphazoline-Pheniramine (OPCON-A) 0.027-0.315 % SOLN Place 1 drop into both eyes daily. Allergy relief  Omega-3 Fatty Acids (FISH OIL PO) Take 1 capsule by mouth daily.     Semaglutide , 1 MG/DOSE, (OZEMPIC , 1 MG/DOSE,) 4 MG/3ML SOPN Inject 1 mg into the skin once a week. 3 mL 2   tamoxifen  (NOLVADEX ) 10 MG tablet TAKE 1 TABLET(10 MG) BY MOUTH DAILY 90 tablet 3   No current facility-administered medications for this visit.    PHYSICAL EXAMINATION: ECOG PERFORMANCE STATUS: 1 -  Symptomatic but completely ambulatory  Vitals:   04/08/24 0842  BP: 125/66  Pulse: 86  Resp: 16  Temp: 98.2 F (36.8 C)  SpO2: 100%   Filed Weights   04/08/24 0842  Weight: 147 lb 6.4 oz (66.9 kg)      LABORATORY DATA:  I have reviewed the data as listed    Latest Ref Rng & Units 05/15/2022   10:30 AM 09/27/2020   10:30 AM  CMP  Glucose 70 - 99 mg/dL 850  825   BUN 6 - 20 mg/dL 11  10   Creatinine 9.55 - 1.00 mg/dL 9.43  9.48   Sodium 864 - 145 mmol/L 136  138   Potassium 3.5 - 5.1 mmol/L 4.5  4.0   Chloride 98 - 111 mmol/L 107  104   CO2 22 - 32 mmol/L 22  23   Calcium  8.9 - 10.3 mg/dL 9.6  8.8   Total Protein 6.5 - 8.1 g/dL  6.3   Total Bilirubin 0.3 - 1.2 mg/dL  0.4   Alkaline Phos 38 - 126 U/L  59   AST 15 - 41 U/L  27   ALT 0 - 44 U/L  27     Lab Results  Component Value Date   WBC 8.6 09/27/2020   HGB 11.1 (L) 09/27/2020   HCT 32.1 (L) 09/27/2020   MCV 95.0 09/27/2020   PLT 358 09/27/2020    ASSESSMENT & PLAN:  Ductal carcinoma in situ (DCIS) of left breast 03/29/2022: Screening mammogram detected left breast calcifications, breast MRI revealed 5.2 cm abnormal enhancement in the left breast extending to the skin below the nipple, biopsy medial and lateral aspect: Revealed high-grade DCIS with necrosis ER 10%, PR 0%  05/24/2022: Left mastectomy: High-grade DCIS 2 cm with necrosis and calcifications, margins negative, ER 10%, PR 0%   Currently undergoing breast reconstruction process with Dr. Arelia.   Current treatment: Tamoxifen  10 mg daily started 04/24/2022 Tamoxifen  toxicities: Denies any hot flashes arthralgias or myalgias. Muscle cramps: Advised her to take over-the-counter magnesium supplement  Encouraged her to stay more active and exercise regularly. Breast cancer surveillance: Breast exam 04/08/2024: Benign Mammogram 03/22/2024: Asymmetry right breast, additional mammogram and ultrasound: Benign breast density category B   Return to clinic in  1 year for follow-up      ------------------------------------- Assessment and Plan Assessment & Plan Ductal carcinoma in situ of left breast status post surgery, on tamoxifen  Status post-surgery for ductal carcinoma in situ of the left breast, currently on tamoxifen . Mammogram on July 21st was normal with low breast density, reducing the risk of breast cancer recurrence. Tamoxifen  is being tolerated with mild hot flashes and leg cramping as side effects. - Continue tamoxifen  10 mg oral daily. - Ensure tamoxifen  refills are available for the next year. - Continue annual mammograms. - Continue annual CT scan of the chest for lung cancer screening.  Leg cramping likely secondary to tamoxifen  Leg cramping likely secondary to tamoxifen  use. She experiences significant leg cramps, particularly at night. - Recommend over-the-counter magnesium glycinate 250 mg  to alleviate leg cramps.  Hot flashes likely secondary to tamoxifen  Mild hot flashes likely secondary to tamoxifen  use.      No orders of the defined types were placed in this encounter.  The patient has a good understanding of the overall plan. she agrees with it. she will call with any problems that may develop before the next visit here. Total time spent: 30 mins including face to face time and time spent for planning, charting and co-ordination of care   Viinay K Rhia Blatchford, MD 04/08/24

## 2024-07-16 ENCOUNTER — Other Ambulatory Visit

## 2024-07-20 ENCOUNTER — Ambulatory Visit (HOSPITAL_BASED_OUTPATIENT_CLINIC_OR_DEPARTMENT_OTHER)
Admission: RE | Admit: 2024-07-20 | Discharge: 2024-07-20 | Disposition: A | Discharge status: Home / Self Care | Source: Ambulatory Visit | Attending: Obstetrics & Gynecology | Admitting: Obstetrics & Gynecology

## 2024-07-20 DIAGNOSIS — Z13820 Encounter for screening for osteoporosis: Secondary | ICD-10-CM | POA: Insufficient documentation

## 2024-07-23 ENCOUNTER — Ambulatory Visit (HOSPITAL_BASED_OUTPATIENT_CLINIC_OR_DEPARTMENT_OTHER): Payer: Self-pay | Admitting: Obstetrics & Gynecology

## 2024-08-09 LAB — LAB REPORT - SCANNED
A1c: 7.4
EGFR: 100

## 2024-09-11 ENCOUNTER — Other Ambulatory Visit: Payer: Self-pay | Admitting: Hematology and Oncology

## 2024-10-08 ENCOUNTER — Other Ambulatory Visit: Payer: Self-pay | Admitting: *Deleted

## 2024-10-08 MED ORDER — TAMOXIFEN CITRATE 10 MG PO TABS: 10.0000 mg | ORAL_TABLET | Freq: Every day | ORAL | 3 refills | Status: AC

## 2025-01-28 ENCOUNTER — Ambulatory Visit: Admitting: Cardiology

## 2025-04-06 ENCOUNTER — Ambulatory Visit: Admitting: Hematology and Oncology
